# Patient Record
Sex: Male | Born: 1970 | Race: White | Hispanic: No | Marital: Single | Smoking: Never smoker
Health system: Southern US, Community
[De-identification: ages and names within clinical notes are randomized; demographics above are authoritative.]

## PROBLEM LIST (undated history)

## (undated) DIAGNOSIS — G9050 Complex regional pain syndrome I, unspecified: Secondary | ICD-10-CM

## (undated) DIAGNOSIS — R1310 Dysphagia, unspecified: Secondary | ICD-10-CM

## (undated) DIAGNOSIS — K222 Esophageal obstruction: Secondary | ICD-10-CM

## (undated) DIAGNOSIS — J309 Allergic rhinitis, unspecified: Secondary | ICD-10-CM

## (undated) DIAGNOSIS — S62309A Unspecified fracture of unspecified metacarpal bone, initial encounter for closed fracture: Secondary | ICD-10-CM

## (undated) DIAGNOSIS — K2270 Barrett's esophagus without dysplasia: Secondary | ICD-10-CM

## (undated) DIAGNOSIS — K219 Gastro-esophageal reflux disease without esophagitis: Secondary | ICD-10-CM

## (undated) DIAGNOSIS — Z91030 Bee allergy status: Secondary | ICD-10-CM

## (undated) DIAGNOSIS — Z860100 Personal history of colon polyps, unspecified: Secondary | ICD-10-CM

## (undated) DIAGNOSIS — IMO0002 Reserved for concepts with insufficient information to code with codable children: Secondary | ICD-10-CM

## (undated) DIAGNOSIS — R002 Palpitations: Secondary | ICD-10-CM

## (undated) DIAGNOSIS — Z800 Family history of malignant neoplasm of digestive organs: Secondary | ICD-10-CM

## (undated) HISTORY — DX: Dysphagia, unspecified: R13.10

## (undated) HISTORY — DX: Personal history of colon polyps, unspecified: Z86.0100

## (undated) HISTORY — DX: Bee allergy status: Z91.030

## (undated) HISTORY — PX: WRIST SURGERY: SHX841

## (undated) HISTORY — PX: COLONOSCOPY: SHX174

## (undated) HISTORY — DX: Barrett's esophagus without dysplasia: K22.70

## (undated) HISTORY — DX: Personal history of colonic polyps: Z86.010

## (undated) HISTORY — DX: Complex regional pain syndrome I, unspecified: G90.50

## (undated) HISTORY — DX: Family history of malignant neoplasm of digestive organs: Z80.0

## (undated) HISTORY — PX: KNEE SURGERY: SHX244

## (undated) HISTORY — DX: Palpitations: R00.2

## (undated) HISTORY — DX: Esophageal obstruction: K22.2

## (undated) HISTORY — DX: Allergic rhinitis, unspecified: J30.9

## (undated) HISTORY — DX: Gastro-esophageal reflux disease without esophagitis: K21.9

---

## 2001-04-27 ENCOUNTER — Encounter: Payer: Self-pay | Admitting: Specialist

## 2001-04-27 ENCOUNTER — Encounter: Admission: RE | Admit: 2001-04-27 | Discharge: 2001-04-27 | Payer: Self-pay | Admitting: Specialist

## 2002-03-18 ENCOUNTER — Encounter: Payer: Self-pay | Admitting: Specialist

## 2002-03-18 ENCOUNTER — Ambulatory Visit (HOSPITAL_COMMUNITY): Admission: RE | Admit: 2002-03-18 | Discharge: 2002-03-18 | Payer: Self-pay | Admitting: Specialist

## 2003-10-28 ENCOUNTER — Ambulatory Visit: Payer: Self-pay | Admitting: Physician Assistant

## 2004-04-06 ENCOUNTER — Ambulatory Visit: Payer: Self-pay | Admitting: Physician Assistant

## 2004-04-27 ENCOUNTER — Ambulatory Visit: Payer: Self-pay | Admitting: Pain Medicine

## 2004-05-11 ENCOUNTER — Ambulatory Visit: Payer: Self-pay | Admitting: Physician Assistant

## 2005-03-02 ENCOUNTER — Ambulatory Visit: Payer: Self-pay | Admitting: Pain Medicine

## 2005-03-18 ENCOUNTER — Encounter: Payer: Self-pay | Admitting: Pain Medicine

## 2005-03-23 ENCOUNTER — Ambulatory Visit: Payer: Self-pay | Admitting: Pain Medicine

## 2005-04-03 ENCOUNTER — Encounter: Payer: Self-pay | Admitting: Pain Medicine

## 2005-06-06 ENCOUNTER — Ambulatory Visit: Payer: Self-pay | Admitting: Pain Medicine

## 2005-06-23 ENCOUNTER — Ambulatory Visit: Payer: Self-pay | Admitting: Pain Medicine

## 2005-07-01 ENCOUNTER — Emergency Department (HOSPITAL_COMMUNITY): Admission: EM | Admit: 2005-07-01 | Discharge: 2005-07-01 | Payer: Self-pay | Admitting: Emergency Medicine

## 2005-07-04 ENCOUNTER — Encounter (HOSPITAL_COMMUNITY): Admission: RE | Admit: 2005-07-04 | Discharge: 2005-09-16 | Payer: Self-pay | Admitting: Emergency Medicine

## 2005-07-25 ENCOUNTER — Ambulatory Visit: Payer: Self-pay | Admitting: Physician Assistant

## 2005-08-23 ENCOUNTER — Ambulatory Visit: Payer: Self-pay | Admitting: Physician Assistant

## 2006-01-09 ENCOUNTER — Ambulatory Visit: Payer: Self-pay | Admitting: Physician Assistant

## 2006-01-24 ENCOUNTER — Ambulatory Visit: Payer: Self-pay | Admitting: Physician Assistant

## 2006-04-14 ENCOUNTER — Ambulatory Visit: Payer: Self-pay | Admitting: Physician Assistant

## 2006-05-16 ENCOUNTER — Ambulatory Visit: Payer: Self-pay | Admitting: Pain Medicine

## 2006-05-31 ENCOUNTER — Ambulatory Visit: Payer: Self-pay | Admitting: Pain Medicine

## 2006-08-29 ENCOUNTER — Ambulatory Visit: Payer: Self-pay | Admitting: Physician Assistant

## 2007-02-15 ENCOUNTER — Ambulatory Visit: Payer: Self-pay | Admitting: Physician Assistant

## 2007-04-11 ENCOUNTER — Ambulatory Visit: Payer: Self-pay | Admitting: Physician Assistant

## 2007-09-12 ENCOUNTER — Ambulatory Visit: Payer: Self-pay | Admitting: Physician Assistant

## 2014-06-13 ENCOUNTER — Emergency Department (HOSPITAL_BASED_OUTPATIENT_CLINIC_OR_DEPARTMENT_OTHER)
Admission: EM | Admit: 2014-06-13 | Discharge: 2014-06-13 | Disposition: A | Discharge status: Home / Self Care | Payer: 59 | Attending: Emergency Medicine | Admitting: Emergency Medicine

## 2014-06-13 ENCOUNTER — Emergency Department (HOSPITAL_BASED_OUTPATIENT_CLINIC_OR_DEPARTMENT_OTHER): Payer: 59

## 2014-06-13 ENCOUNTER — Encounter (HOSPITAL_BASED_OUTPATIENT_CLINIC_OR_DEPARTMENT_OTHER): Payer: Self-pay

## 2014-06-13 DIAGNOSIS — Z79899 Other long term (current) drug therapy: Secondary | ICD-10-CM | POA: Insufficient documentation

## 2014-06-13 DIAGNOSIS — Z7982 Long term (current) use of aspirin: Secondary | ICD-10-CM | POA: Diagnosis not present

## 2014-06-13 DIAGNOSIS — Z8669 Personal history of other diseases of the nervous system and sense organs: Secondary | ICD-10-CM | POA: Diagnosis not present

## 2014-06-13 DIAGNOSIS — W500XXA Accidental hit or strike by another person, initial encounter: Secondary | ICD-10-CM | POA: Insufficient documentation

## 2014-06-13 DIAGNOSIS — S0181XA Laceration without foreign body of other part of head, initial encounter: Secondary | ICD-10-CM

## 2014-06-13 DIAGNOSIS — Y9389 Activity, other specified: Secondary | ICD-10-CM | POA: Diagnosis not present

## 2014-06-13 DIAGNOSIS — Y998 Other external cause status: Secondary | ICD-10-CM | POA: Diagnosis not present

## 2014-06-13 DIAGNOSIS — S0992XA Unspecified injury of nose, initial encounter: Secondary | ICD-10-CM | POA: Insufficient documentation

## 2014-06-13 DIAGNOSIS — Y9289 Other specified places as the place of occurrence of the external cause: Secondary | ICD-10-CM | POA: Diagnosis not present

## 2014-06-13 DIAGNOSIS — S0990XA Unspecified injury of head, initial encounter: Secondary | ICD-10-CM | POA: Diagnosis present

## 2014-06-13 HISTORY — DX: Reserved for concepts with insufficient information to code with codable children: IMO0002

## 2014-06-13 MED ORDER — LIDOCAINE-EPINEPHRINE 1 %-1:100000 IJ SOLN
5.0000 mL | Freq: Once | INTRAMUSCULAR | Status: AC
Start: 2014-06-13 — End: 2014-06-13
  Administered 2014-06-13: 5 mL

## 2014-06-13 MED ORDER — LIDOCAINE-EPINEPHRINE 1 %-1:100000 IJ SOLN
INTRAMUSCULAR | Status: AC
  Administered 2014-06-13: 5 mL
  Filled 2014-06-13: qty 1

## 2014-06-13 MED ORDER — CEPHALEXIN 500 MG PO CAPS: 500.0000 mg | ORAL_CAPSULE | Freq: Three times a day (TID) | ORAL | Status: DC

## 2014-06-13 NOTE — ED Notes (Signed)
MD at bedside for wound repair.

## 2014-06-13 NOTE — ED Notes (Signed)
MD at bedside. 

## 2014-06-13 NOTE — Discharge Instructions (Signed)
Facial Laceration  A facial laceration is a cut on the face. These injuries can be painful and cause bleeding. Lacerations usually heal quickly, but they need special care to reduce scarring. DIAGNOSIS  Your health care provider will take a medical history, ask for details about how the injury occurred, and examine the wound to determine how deep the cut is. TREATMENT  Some facial lacerations may not require closure. Others may not be able to be closed because of an increased risk of infection. The risk of infection and the chance for successful closure will depend on various factors, including the amount of time since the injury occurred. The wound may be cleaned to help prevent infection. If closure is appropriate, pain medicines may be given if needed. Your health care provider will use stitches (sutures), wound glue (adhesive), or skin adhesive strips to repair the laceration. These tools bring the skin edges together to allow for faster healing and a better cosmetic outcome. If needed, you may also be given a tetanus shot. HOME CARE INSTRUCTIONS  Only take over-the-counter or prescription medicines as directed by your health care provider.  Follow your health care provider's instructions for wound care. These instructions will vary depending on the technique used for closing the wound. For Sutures:  Keep the wound clean and dry.   If you were given a bandage (dressing), you should change it at least once a day. Also change the dressing if it becomes wet or dirty, or as directed by your health care provider.   Wash the wound with soap and water 2 times a day. Rinse the wound off with water to remove all soap. Pat the wound dry with a clean towel.   After cleaning, apply a thin layer of the antibiotic ointment recommended by your health care provider. This will help prevent infection and keep the dressing from sticking.   You may shower as usual after the first 24 hours. Do not soak the  wound in water until the sutures are removed.   Get your sutures removed as directed by your health care provider. With facial lacerations, sutures should usually be taken out after 4-5 days to avoid stitch marks.   Wait a few days after your sutures are removed before applying any makeup. For Skin Adhesive Strips:  Keep the wound clean and dry.   Do not get the skin adhesive strips wet. You may bathe carefully, using caution to keep the wound dry.   If the wound gets wet, pat it dry with a clean towel.   Skin adhesive strips will fall off on their own. You may trim the strips as the wound heals. Do not remove skin adhesive strips that are still stuck to the wound. They will fall off in time.  For Wound Adhesive:  You may briefly wet your wound in the shower or bath. Do not soak or scrub the wound. Do not swim. Avoid periods of heavy sweating until the skin adhesive has fallen off on its own. After showering or bathing, gently pat the wound dry with a clean towel.   Do not apply liquid medicine, cream medicine, ointment medicine, or makeup to your wound while the skin adhesive is in place. This may loosen the film before your wound is healed.   If a dressing is placed over the wound, be careful not to apply tape directly over the skin adhesive. This may cause the adhesive to be pulled off before the wound is healed.   Avoid   prolonged exposure to sunlight or tanning lamps while the skin adhesive is in place.  The skin adhesive will usually remain in place for 5-10 days, then naturally fall off the skin. Do not pick at the adhesive film.  After Healing: Once the wound has healed, cover the wound with sunscreen during the day for 1 full year. This can help minimize scarring. Exposure to ultraviolet light in the first year will darken the scar. It can take 1-2 years for the scar to lose its redness and to heal completely.  SEEK IMMEDIATE MEDICAL CARE IF:  You have redness, pain, or  swelling around the wound.   You see ayellowish-white fluid (pus) coming from the wound.   You have chills or a fever.  MAKE SURE YOU:  Understand these instructions.  Will watch your condition.  Will get help right away if you are not doing well or get worse. Document Released: 01/28/2004 Document Revised: 10/10/2012 Document Reviewed: 08/02/2012 Exit[employer] Patient Information 2015 [employer]. This information is not intended to replace advice given to you by your health care provider. Make sure you discuss any questions you have with your health care provider.  
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
## 2014-08-30 ENCOUNTER — Encounter (HOSPITAL_BASED_OUTPATIENT_CLINIC_OR_DEPARTMENT_OTHER): Payer: Self-pay | Admitting: *Deleted

## 2014-08-30 ENCOUNTER — Emergency Department (HOSPITAL_BASED_OUTPATIENT_CLINIC_OR_DEPARTMENT_OTHER)
Admission: EM | Admit: 2014-08-30 | Discharge: 2014-08-30 | Disposition: A | Discharge status: Home / Self Care | Payer: 59 | Attending: Physician Assistant | Admitting: Physician Assistant

## 2014-08-30 DIAGNOSIS — Z7982 Long term (current) use of aspirin: Secondary | ICD-10-CM | POA: Diagnosis not present

## 2014-08-30 DIAGNOSIS — W228XXA Striking against or struck by other objects, initial encounter: Secondary | ICD-10-CM | POA: Insufficient documentation

## 2014-08-30 DIAGNOSIS — Y92481 Parking lot as the place of occurrence of the external cause: Secondary | ICD-10-CM | POA: Diagnosis not present

## 2014-08-30 DIAGNOSIS — Z8669 Personal history of other diseases of the nervous system and sense organs: Secondary | ICD-10-CM | POA: Insufficient documentation

## 2014-08-30 DIAGNOSIS — Z79899 Other long term (current) drug therapy: Secondary | ICD-10-CM | POA: Diagnosis not present

## 2014-08-30 DIAGNOSIS — Y998 Other external cause status: Secondary | ICD-10-CM | POA: Insufficient documentation

## 2014-08-30 DIAGNOSIS — Z792 Long term (current) use of antibiotics: Secondary | ICD-10-CM | POA: Diagnosis not present

## 2014-08-30 DIAGNOSIS — S0101XA Laceration without foreign body of scalp, initial encounter: Secondary | ICD-10-CM

## 2014-08-30 DIAGNOSIS — Y9389 Activity, other specified: Secondary | ICD-10-CM | POA: Diagnosis not present

## 2014-08-30 MED ORDER — IBUPROFEN 800 MG PO TABS
800.0000 mg | ORAL_TABLET | Freq: Once | ORAL | Status: AC
  Administered 2014-08-30: 800 mg via ORAL
  Filled 2014-08-30: qty 1
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
## 2014-08-30 NOTE — Discharge Instructions (Signed)
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
## 2016-07-09 ENCOUNTER — Encounter (HOSPITAL_BASED_OUTPATIENT_CLINIC_OR_DEPARTMENT_OTHER): Payer: Self-pay | Admitting: Emergency Medicine

## 2016-07-09 ENCOUNTER — Emergency Department (HOSPITAL_BASED_OUTPATIENT_CLINIC_OR_DEPARTMENT_OTHER): Payer: 59

## 2016-07-09 ENCOUNTER — Emergency Department (HOSPITAL_BASED_OUTPATIENT_CLINIC_OR_DEPARTMENT_OTHER)
Admission: EM | Admit: 2016-07-09 | Discharge: 2016-07-09 | Disposition: A | Discharge status: Home / Self Care | Payer: 59 | Attending: Emergency Medicine | Admitting: Emergency Medicine

## 2016-07-09 DIAGNOSIS — Z79899 Other long term (current) drug therapy: Secondary | ICD-10-CM | POA: Diagnosis not present

## 2016-07-09 DIAGNOSIS — S62347A Nondisplaced fracture of base of fifth metacarpal bone. left hand, initial encounter for closed fracture: Secondary | ICD-10-CM | POA: Diagnosis not present

## 2016-07-09 DIAGNOSIS — Y9355 Activity, bike riding: Secondary | ICD-10-CM | POA: Insufficient documentation

## 2016-07-09 DIAGNOSIS — S0181XA Laceration without foreign body of other part of head, initial encounter: Secondary | ICD-10-CM | POA: Diagnosis not present

## 2016-07-09 DIAGNOSIS — S6992XA Unspecified injury of left wrist, hand and finger(s), initial encounter: Secondary | ICD-10-CM | POA: Diagnosis present

## 2016-07-09 DIAGNOSIS — Y929 Unspecified place or not applicable: Secondary | ICD-10-CM | POA: Insufficient documentation

## 2016-07-09 DIAGNOSIS — Z23 Encounter for immunization: Secondary | ICD-10-CM | POA: Diagnosis not present

## 2016-07-09 DIAGNOSIS — Y999 Unspecified external cause status: Secondary | ICD-10-CM | POA: Diagnosis not present

## 2016-07-09 DIAGNOSIS — Z7982 Long term (current) use of aspirin: Secondary | ICD-10-CM | POA: Insufficient documentation

## 2016-07-09 MED ORDER — IBUPROFEN 800 MG PO TABS: 800.0000 mg | ORAL_TABLET | Freq: Three times a day (TID) | ORAL | 0 refills | Status: DC | PRN

## 2016-07-09 MED ORDER — TETANUS-DIPHTH-ACELL PERTUSSIS 5-2.5-18.5 LF-MCG/0.5 IM SUSP
0.5000 mL | Freq: Once | INTRAMUSCULAR | Status: AC
  Administered 2016-07-09: 0.5 mL via INTRAMUSCULAR
  Filled 2016-07-09: qty 0.5
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
## 2016-07-09 NOTE — Discharge Instructions (Signed)
It was my pleasure taking care of you today!   You will need to follow up with the hand surgeon listed. Please go to his office on Monday at 12:30pm.  Ibuprofen as needed for pain. Elevate hand for help with pain/swelling.   Return to ER for new or worsening symptoms, any additional concerns.

## 2016-07-09 NOTE — ED Notes (Signed)
ED Provider at bedside.  Went to to assess patient and Provider is doing full assessment on the patient.
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
## 2016-07-11 ENCOUNTER — Other Ambulatory Visit: Payer: Self-pay | Admitting: Orthopedic Surgery

## 2016-07-11 ENCOUNTER — Encounter (HOSPITAL_BASED_OUTPATIENT_CLINIC_OR_DEPARTMENT_OTHER): Payer: Self-pay | Admitting: *Deleted

## 2016-07-11 NOTE — Anesthesia Preprocedure Evaluation (Signed)
Anesthesia Evaluation  Patient identified by MRN, date of birth, ID band Patient awake    Reviewed: Allergy & Precautions, NPO status , Patient's Chart, lab work & pertinent test results  Airway Mallampati: II  TM Distance: >3 FB Neck ROM: Full    Dental no notable dental hx.    Pulmonary neg pulmonary ROS,    Pulmonary exam normal breath sounds clear to auscultation       Cardiovascular negative cardio ROS Normal cardiovascular exam Rhythm:Regular Rate:Normal     Neuro/Psych  Neuromuscular disease negative psych ROS   GI/Hepatic negative GI ROS, Neg liver ROS,   Endo/Other  negative endocrine ROS  Renal/GU negative Renal ROS     Musculoskeletal negative musculoskeletal ROS (+)   Abdominal   Peds  Hematology negative hematology ROS (+)   Anesthesia Other Findings   Reproductive/Obstetrics                             Anesthesia Physical Anesthesia Plan  ASA: II  Anesthesia Plan: General and Regional   Post-op Pain Management:    Induction: Intravenous  PONV Risk Score and Plan: 2 and Ondansetron and Dexamethasone  Airway Management Planned:   Additional Equipment:   Intra-op Plan:   Post-operative Plan: Extubation in OR  Informed Consent: I have reviewed the patients History and Physical, chart, labs and discussed the procedure including the risks, benefits and alternatives for the proposed anesthesia with the patient or authorized representative who has indicated his/her understanding and acceptance.   Dental advisory given  Plan Discussed with: CRNA  Anesthesia Plan Comments:         Anesthesia Quick Evaluation

## 2016-07-12 ENCOUNTER — Encounter (HOSPITAL_BASED_OUTPATIENT_CLINIC_OR_DEPARTMENT_OTHER): Payer: Self-pay | Admitting: Anesthesiology

## 2016-07-12 ENCOUNTER — Encounter (HOSPITAL_BASED_OUTPATIENT_CLINIC_OR_DEPARTMENT_OTHER)
Admission: RE | Disposition: A | Discharge status: Home / Self Care | Payer: Self-pay | Source: Ambulatory Visit | Attending: Orthopedic Surgery

## 2016-07-12 ENCOUNTER — Ambulatory Visit (HOSPITAL_COMMUNITY): Payer: 59

## 2016-07-12 ENCOUNTER — Ambulatory Visit (HOSPITAL_BASED_OUTPATIENT_CLINIC_OR_DEPARTMENT_OTHER): Payer: 59 | Admitting: Anesthesiology

## 2016-07-12 ENCOUNTER — Ambulatory Visit (HOSPITAL_BASED_OUTPATIENT_CLINIC_OR_DEPARTMENT_OTHER)
Admission: RE | Admit: 2016-07-12 | Discharge: 2016-07-12 | Disposition: A | Discharge status: Home / Self Care | Payer: 59 | Source: Ambulatory Visit | Attending: Orthopedic Surgery | Admitting: Orthopedic Surgery

## 2016-07-12 DIAGNOSIS — G90511 Complex regional pain syndrome I of right upper limb: Secondary | ICD-10-CM | POA: Diagnosis not present

## 2016-07-12 DIAGNOSIS — Y929 Unspecified place or not applicable: Secondary | ICD-10-CM | POA: Diagnosis not present

## 2016-07-12 DIAGNOSIS — I2510 Atherosclerotic heart disease of native coronary artery without angina pectoris: Secondary | ICD-10-CM | POA: Insufficient documentation

## 2016-07-12 DIAGNOSIS — Y9355 Activity, bike riding: Secondary | ICD-10-CM | POA: Insufficient documentation

## 2016-07-12 DIAGNOSIS — Z79899 Other long term (current) drug therapy: Secondary | ICD-10-CM | POA: Diagnosis not present

## 2016-07-12 DIAGNOSIS — Z4789 Encounter for other orthopedic aftercare: Secondary | ICD-10-CM

## 2016-07-12 DIAGNOSIS — S62317A Displaced fracture of base of fifth metacarpal bone. left hand, initial encounter for closed fracture: Secondary | ICD-10-CM | POA: Diagnosis present

## 2016-07-12 HISTORY — DX: Unspecified fracture of unspecified metacarpal bone, initial encounter for closed fracture: S62.309A

## 2016-07-12 HISTORY — PX: OPEN REDUCTION INTERNAL FIXATION (ORIF) METACARPAL: SHX6234

## 2016-07-12 SURGERY — OPEN REDUCTION INTERNAL FIXATION (ORIF) METACARPAL
Anesthesia: General | Site: Hand | Laterality: Left

## 2016-07-12 MED ORDER — KETOROLAC TROMETHAMINE 30 MG/ML IJ SOLN: 30.0000 mg | Freq: Once | INTRAMUSCULAR | Status: DC | PRN

## 2016-07-12 MED ORDER — DEXAMETHASONE SODIUM PHOSPHATE 4 MG/ML IJ SOLN
INTRAMUSCULAR | Status: DC | PRN
  Administered 2016-07-12: 10 mg via INTRAVENOUS

## 2016-07-12 MED ORDER — LIDOCAINE 2% (20 MG/ML) 5 ML SYRINGE
INTRAMUSCULAR | Status: DC | PRN
  Administered 2016-07-12: 100 mg via INTRAVENOUS

## 2016-07-12 MED ORDER — LIDOCAINE HCL (PF) 1 % IJ SOLN
INTRAMUSCULAR | Status: AC
  Filled 2016-07-12: qty 30

## 2016-07-12 MED ORDER — LACTATED RINGERS IV SOLN
INTRAVENOUS | Status: DC
  Administered 2016-07-12: 07:00:00 via INTRAVENOUS

## 2016-07-12 MED ORDER — ACETAMINOPHEN 325 MG PO TABS: 650.0000 mg | ORAL_TABLET | Freq: Four times a day (QID) | ORAL | Status: AC | PRN

## 2016-07-12 MED ORDER — BUPIVACAINE-EPINEPHRINE (PF) 0.5% -1:200000 IJ SOLN
INTRAMUSCULAR | Status: AC
  Filled 2016-07-12: qty 30

## 2016-07-12 MED ORDER — ONDANSETRON HCL 4 MG/2ML IJ SOLN
INTRAMUSCULAR | Status: DC | PRN
  Administered 2016-07-12: 4 mg via INTRAVENOUS

## 2016-07-12 MED ORDER — LIDOCAINE HCL (PF) 1 % IJ SOLN
INTRAMUSCULAR | Status: DC | PRN
  Administered 2016-07-12: 5 mL

## 2016-07-12 MED ORDER — MEPERIDINE HCL 25 MG/ML IJ SOLN: 6.2500 mg | INTRAMUSCULAR | Status: DC | PRN

## 2016-07-12 MED ORDER — MIDAZOLAM HCL 2 MG/2ML IJ SOLN
1.0000 mg | INTRAMUSCULAR | Status: DC | PRN
Start: 2016-07-12 — End: 2016-07-12
  Administered 2016-07-12: 2 mg via INTRAVENOUS

## 2016-07-12 MED ORDER — SCOPOLAMINE 1 MG/3DAYS TD PT72: 1.0000 | MEDICATED_PATCH | Freq: Once | TRANSDERMAL | Status: DC | PRN

## 2016-07-12 MED ORDER — IBUPROFEN 200 MG PO TABS: 600.0000 mg | ORAL_TABLET | Freq: Four times a day (QID) | ORAL | Status: DC | PRN

## 2016-07-12 MED ORDER — PROMETHAZINE HCL 25 MG/ML IJ SOLN: 6.2500 mg | INTRAMUSCULAR | Status: DC | PRN

## 2016-07-12 MED ORDER — CEFAZOLIN SODIUM-DEXTROSE 2-4 GM/100ML-% IV SOLN
2.0000 g | INTRAVENOUS | Status: AC
  Administered 2016-07-12: 2 g via INTRAVENOUS

## 2016-07-12 MED ORDER — FENTANYL CITRATE (PF) 100 MCG/2ML IJ SOLN
INTRAMUSCULAR | Status: AC
  Filled 2016-07-12: qty 2

## 2016-07-12 MED ORDER — ONDANSETRON HCL 4 MG/2ML IJ SOLN
INTRAMUSCULAR | Status: AC
  Filled 2016-07-12: qty 2

## 2016-07-12 MED ORDER — BUPIVACAINE-EPINEPHRINE 0.5% -1:200000 IJ SOLN
INTRAMUSCULAR | Status: DC | PRN
Start: 2016-07-12 — End: 2016-07-12
  Administered 2016-07-12: 5 mg

## 2016-07-12 MED ORDER — OXYCODONE HCL 5 MG PO TABS: 5.0000 mg | ORAL_TABLET | Freq: Four times a day (QID) | ORAL | 0 refills | Status: DC | PRN

## 2016-07-12 MED ORDER — PROPOFOL 500 MG/50ML IV EMUL
INTRAVENOUS | Status: AC
  Filled 2016-07-12: qty 50

## 2016-07-12 MED ORDER — OXYCODONE HCL 5 MG PO TABS: 5.0000 mg | ORAL_TABLET | Freq: Once | ORAL | Status: DC | PRN

## 2016-07-12 MED ORDER — HYDROMORPHONE HCL 1 MG/ML IJ SOLN: 0.2500 mg | INTRAMUSCULAR | Status: DC | PRN

## 2016-07-12 MED ORDER — CEFAZOLIN SODIUM-DEXTROSE 2-4 GM/100ML-% IV SOLN
INTRAVENOUS | Status: AC
  Filled 2016-07-12: qty 100

## 2016-07-12 MED ORDER — OXYCODONE HCL 5 MG/5ML PO SOLN: 5.0000 mg | Freq: Once | ORAL | Status: DC | PRN

## 2016-07-12 MED ORDER — PROPOFOL 10 MG/ML IV BOLUS
INTRAVENOUS | Status: DC | PRN
  Administered 2016-07-12: 50 mg via INTRAVENOUS
  Administered 2016-07-12: 200 mg via INTRAVENOUS

## 2016-07-12 MED ORDER — FENTANYL CITRATE (PF) 100 MCG/2ML IJ SOLN
50.0000 ug | INTRAMUSCULAR | Status: AC | PRN
  Administered 2016-07-12 (×2): 25 ug via INTRAVENOUS
  Administered 2016-07-12: 100 ug via INTRAVENOUS

## 2016-07-12 MED ORDER — DEXAMETHASONE SODIUM PHOSPHATE 10 MG/ML IJ SOLN
INTRAMUSCULAR | Status: AC
  Filled 2016-07-12: qty 1

## 2016-07-12 MED ORDER — LACTATED RINGERS IV SOLN: INTRAVENOUS | Status: DC

## 2016-07-12 MED ORDER — LIDOCAINE HCL (CARDIAC) 20 MG/ML IV SOLN
INTRAVENOUS | Status: AC
  Filled 2016-07-12: qty 5

## 2016-07-12 MED ORDER — MIDAZOLAM HCL 2 MG/2ML IJ SOLN
INTRAMUSCULAR | Status: AC
Start: 2016-07-12 — End: 2016-07-12
  Filled 2016-07-12: qty 2

## 2016-07-12 SURGICAL SUPPLY — 50 items
BLADE MINI RND TIP GREEN BEAV (BLADE) IMPLANT
BLADE SURG 15 STRL LF DISP TIS (BLADE) ×1 IMPLANT
BLADE SURG 15 STRL SS (BLADE) ×2
BNDG CMPR 9X4 STRL LF SNTH (GAUZE/BANDAGES/DRESSINGS) ×1
BNDG COHESIVE 4X5 TAN STRL (GAUZE/BANDAGES/DRESSINGS) ×2 IMPLANT
BNDG ESMARK 4X9 LF (GAUZE/BANDAGES/DRESSINGS) ×2 IMPLANT
BNDG GAUZE ELAST 4 BULKY (GAUZE/BANDAGES/DRESSINGS) ×2 IMPLANT
CANISTER SUCTION 1200CC (MISCELLANEOUS) IMPLANT
CHLORAPREP W/TINT 26ML (MISCELLANEOUS) ×2 IMPLANT
CORD BIPOLAR FORCEPS 12FT (ELECTRODE) ×2 IMPLANT
COVER BACK TABLE 60X90IN (DRAPES) ×2 IMPLANT
COVER MAYO STAND STRL (DRAPES) ×2 IMPLANT
CUFF TOURNIQUET SINGLE 18IN (TOURNIQUET CUFF) IMPLANT
DRAPE C-ARM 42X72 X-RAY (DRAPES) ×2 IMPLANT
DRAPE EXTREMITY T 121X128X90 (DRAPE) ×2 IMPLANT
DRAPE SURG 17X23 STRL (DRAPES) ×2 IMPLANT
DRSG EMULSION OIL 3X3 NADH (GAUZE/BANDAGES/DRESSINGS) ×2 IMPLANT
GAUZE SPONGE 4X4 12PLY STRL LF (GAUZE/BANDAGES/DRESSINGS) ×2 IMPLANT
GLOVE BIO SURGEON STRL SZ 6.5 (GLOVE) ×2 IMPLANT
GLOVE BIO SURGEON STRL SZ7.5 (GLOVE) ×2 IMPLANT
GLOVE BIOGEL PI IND STRL 7.0 (GLOVE) ×1 IMPLANT
GLOVE BIOGEL PI IND STRL 8 (GLOVE) ×1 IMPLANT
GLOVE BIOGEL PI INDICATOR 7.0 (GLOVE) ×4
GLOVE BIOGEL PI INDICATOR 8 (GLOVE) ×1
GLOVE ECLIPSE 6.5 STRL STRAW (GLOVE) ×2 IMPLANT
GOWN STRL REUS W/ TWL LRG LVL3 (GOWN DISPOSABLE) ×2 IMPLANT
GOWN STRL REUS W/TWL LRG LVL3 (GOWN DISPOSABLE) ×4
GOWN STRL REUS W/TWL XL LVL3 (GOWN DISPOSABLE) ×2 IMPLANT
K-WIRE .062X4 (WIRE) ×2 IMPLANT
NDL HYPO 25X1 1.5 SAFETY (NEEDLE) IMPLANT
NEEDLE HYPO 25X1 1.5 SAFETY (NEEDLE) IMPLANT
NS IRRIG 1000ML POUR BTL (IV SOLUTION) ×2 IMPLANT
PACK BASIN DAY SURGERY FS (CUSTOM PROCEDURE TRAY) ×2 IMPLANT
PADDING CAST ABS 4INX4YD NS (CAST SUPPLIES)
PADDING CAST ABS COTTON 4X4 ST (CAST SUPPLIES) IMPLANT
SLEEVE SCD COMPRESS KNEE MED (MISCELLANEOUS) ×2 IMPLANT
SPLINT PLASTER CAST XFAST 3X15 (CAST SUPPLIES) ×1 IMPLANT
SPLINT PLASTER XTRA FASTSET 3X (CAST SUPPLIES) ×1
STOCKINETTE 6  STRL (DRAPES) ×1
STOCKINETTE 6 STRL (DRAPES) ×1 IMPLANT
SUCTION FRAZIER HANDLE 10FR (MISCELLANEOUS)
SUCTION TUBE FRAZIER 10FR DISP (MISCELLANEOUS) IMPLANT
SUT VICRYL RAPIDE 4-0 (SUTURE) IMPLANT
SUT VICRYL RAPIDE 4/0 PS 2 (SUTURE) IMPLANT
SYR 10ML LL (SYRINGE) IMPLANT
SYR BULB 3OZ (MISCELLANEOUS) IMPLANT
TOWEL OR 17X24 6PK STRL BLUE (TOWEL DISPOSABLE) ×2 IMPLANT
TOWEL OR NON WOVEN STRL DISP B (DISPOSABLE) IMPLANT
TUBE CONNECTING 20X1/4 (TUBING) IMPLANT
UNDERPAD 30X30 (UNDERPADS AND DIAPERS) ×2 IMPLANT

## 2016-07-12 NOTE — Op Note (Signed)
07/12/2016  7:31 AM  PATIENT:  Eugene Davis  46 y.o. male  PRE-OPERATIVE DIAGNOSIS:  Displaced intra-articular left 5th MC base fracture  POST-OPERATIVE DIAGNOSIS:  Same  PROCEDURE:  ORIF L 5th MC fx  SURGEON: Cliffton Astersavid A. Janee Mornhompson, MD  PHYSICIAN ASSISTANT: None  ANESTHESIA:  general  SPECIMENS:  None  DRAINS:   None  EBL:  less than 50 mL  PREOPERATIVE INDICATIONS:  [name] is a  46 y.o. male with a displaced intra-articular left 5th MC base fracture  The risks benefits and alternatives were discussed with the patient preoperatively including but not limited to the risks of infection, bleeding, nerve injury, cardiopulmonary complications, the need for revision surgery, among others, and the patient verbalized understanding and consented to proceed.  OPERATIVE IMPLANTS: 0.062 in K-wires x 2  OPERATIVE PROCEDURE:  After receiving prophylactic antibiotics, the patient was escorted to the operative theatre and placed in a supine position. General anesthesia was administered, and an ulnar nerve block was performed by me with a mixture of lidocaine and Marcaine bearing epinephrine.  A surgical "time-out" was performed during which the planned procedure, proposed operative site, and the correct patient identity were compared to the operative consent and agreement confirmed by the circulating nurse according to current facility policy.  Following application of a tourniquet to the operative extremity, the exposed skin was pre-scrubbed with a Hibiclens scrub brush before being formally prepped with Chloraprep and draped in the usual sterile fashion.  The limb was exsanguinated with an Esmarch bandage and the tourniquet inflated to approximately 100mmHg higher than systolic BP.  The fifth metacarpal length was gained through traction and a 0.062 inch K wire driven across the fifth into the fourth.  This gained appropriate length of the metacarpal.  I tried percutaneously manipulating the  basal fragments, but this proved unsuccessful.  A 1-1/2 inch incision was made over the base of the metacarpal and exposing it in this manner, there was a large articular segment that was impacted and displaced quite a bit.  It was placed into anatomic alignment and secured with a 0.062 inch K wire driven again across the base of the fifth into the fourth, securing the articular reduction.  With the segment thus disimpacted and some of the crumbled fragments placed as a graft into the area that was previously impacted, the wound is copiously irrigated and the capsule ligamentous layer at the base of the fifth was reapproximated with 4-0 Vicryl Rapide interrupted suture.  The wound was irrigated, the tourniquet released, additional hemostasis obtained with bipolar electrocautery.  The skin was closed with 4-0 Vicryl Rapide running horizontal mattress suture.  The K wires were bent 90 at the skin and clipped and final images were obtained.  This revealed near-anatomic alignment of the articular surface with good length of the fifth metacarpal.  A short arm splint dressing was applied with a volar plaster component and he was awakened and taken to the recovery room in stable condition, breathing spontaneously.  DISPOSITION: He'll be discharged home today with typical instructions, returning in 7-10 days, with new x-rays of the left hand out of splint and conversion to short arm cast.

## 2016-07-12 NOTE — Transfer of Care (Signed)
Immediate Anesthesia Transfer of Care Note  Patient: Joretta BachelorHarvey A Gersten  Procedure(s) Performed: Procedure(s): OPEN TREATMENT OF LEFT 5TH METACARPAL BASE FRACTURE (Left)  Patient Location: PACU  Anesthesia Type:General  Level of Consciousness: awake, sedated and patient cooperative  Airway & Oxygen Therapy: Patient Spontanous Breathing and Patient connected to face mask oxygen  Post-op Assessment: Report given to RN and Post -op Vital signs reviewed and stable  Post vital signs: Reviewed and stable  Last Vitals:  Vitals:   07/11/16 1017  BP: 136/79  Pulse: 66  Resp: 18  Temp: 36.7 C    Last Pain:  Vitals:   07/11/16 1017  TempSrc: Oral  PainSc: 0-No pain      Patients Stated Pain Goal: 0 (07/11/16 1017)  Complications: No apparent anesthesia complications

## 2016-07-12 NOTE — Anesthesia Procedure Notes (Signed)
Procedure Name: LMA Insertion Date/Time: 07/12/2016 7:40 AM Performed by: Gar GibbonKEETON, Willene Holian S Pre-anesthesia Checklist: Patient identified, Emergency Drugs available, Suction available and Patient being monitored Patient Re-evaluated:Patient Re-evaluated prior to inductionOxygen Delivery Method: Circle system utilized Preoxygenation: Pre-oxygenation with 100% oxygen Intubation Type: IV induction Ventilation: Mask ventilation without difficulty LMA: LMA inserted LMA Size: 5.0 Number of attempts: 1 Airway Equipment and Method: Bite block Placement Confirmation: positive ETCO2 Tube secured with: Tape Dental Injury: Teeth and Oropharynx as per pre-operative assessment

## 2016-07-12 NOTE — Interval H&P Note (Signed)
History and Physical Interval Note:  07/12/2016 7:31 AM  Eugene Davis  has presented today for surgery, with the diagnosis of LEFT 5TH METACARPAL BASE FRACTURE S62.317A  The various methods of treatment have been discussed with the patient and family. After consideration of risks, benefits and other options for treatment, the patient has consented to  Procedure(s): OPEN TREATMENT OF LEFT 5TH METACARPAL BASE FRACTURE (Left) as a surgical intervention .  The patient's history has been reviewed, patient examined, no change in status, stable for surgery.  I have reviewed the patient's chart and labs.  Questions were answered to the patient's satisfaction.     Brae Schaafsma A.

## 2016-07-12 NOTE — Anesthesia Postprocedure Evaluation (Addendum)
Anesthesia Post Note  Patient: Eugene Davis  Procedure(s) Performed: Procedure(s) (LRB): OPEN TREATMENT OF LEFT 5TH METACARPAL BASE FRACTURE (Left)     Patient location during evaluation: PACU Anesthesia Type: General Level of consciousness: sedated and patient cooperative Pain management: pain level controlled Vital Signs Assessment: post-procedure vital signs reviewed and stable Respiratory status: spontaneous breathing Cardiovascular status: stable Anesthetic complications: no    Last Vitals:  Vitals:   07/12/16 0915 07/12/16 0927  BP: 134/86 138/87  Pulse: 74 69  Resp: 11   Temp:  36.4 C    Last Pain:  Vitals:   07/12/16 0927  TempSrc: Oral  PainSc:                  Lewie LoronJohn Willman Cuny

## 2016-07-12 NOTE — Discharge Instructions (Addendum)
Discharge Instructions ° ° °You have a dressing with a plaster splint incorporated in it. °Move your fingers as much as possible, making a full fist and fully opening the fist. °Elevate your hand to reduce pain & swelling of the digits.  Ice over the operative site may be helpful to reduce pain & swelling.  DO NOT USE HEAT. °Leave the dressing in place until you return to our office.  °You may shower, but keep the bandage clean & dry.  °You may drive a car when you are off of prescription pain medications and can safely control your vehicle with both hands. ° ° °Please call [phone] during normal business hours or [phone] after hours for any problems. Including the following: ° °- excessive redness of the incisions °- drainage for more than 4 days °- fever of more than 101.5 F ° °*Please note that pain medications will not be refilled after hours or on weekends. ° ° ° °Post Anesthesia Home Care Instructions ° °Activity: °Get plenty of rest for the remainder of the day. A responsible individual must stay with you for 24 hours following the procedure.  °For the next 24 hours, DO NOT: °-Drive a car °-Operate machinery °-Drink alcoholic beverages °-Take any medication unless instructed by your physician °-Make any legal decisions or sign important papers. ° °Meals: °Start with liquid foods such as gelatin or soup. Progress to regular foods as tolerated. Avoid greasy, spicy, heavy foods. If nausea and/or vomiting occur, drink only clear liquids until the nausea and/or vomiting subsides. Call your physician if vomiting continues. ° °Special Instructions/Symptoms: °Your throat may feel dry or sore from the anesthesia or the breathing tube placed in your throat during surgery. If this causes discomfort, gargle with warm salt water. The discomfort should disappear within 24 hours. ° °If you had a scopolamine patch placed behind your ear for the management of post- operative nausea and/or vomiting: ° °1. The  medication in the patch is effective for 72 hours, after which it should be removed.  Wrap patch in a tissue and discard in the trash. Wash hands thoroughly with soap and water. °2. You may remove the patch earlier than 72 hours if you experience unpleasant side effects which may include dry mouth, dizziness or visual disturbances. °3. Avoid touching the patch. Wash your hands with soap and water after contact with the patch. °  ° ° °
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
## 2016-07-12 NOTE — Interval H&P Note (Signed)
History and Physical Interval Note:  07/12/2016 6:53 AM  Eugene Davis  has presented today for surgery, with the diagnosis of LEFT 5TH METACARPAL BASE FRACTURE S62.317A  The various methods of treatment have been discussed with the patient and family. After consideration of risks, benefits and other options for treatment, the patient has consented to  Procedure(s): OPEN TREATMENT OF LEFT 5TH METACARPAL BASE FRACTURE (Left) as a surgical intervention .  The patient's history has been reviewed, patient examined, no change in status, stable for surgery.  I have reviewed the patient's chart and labs.  Questions were answered to the patient's satisfaction.     Tannor Pyon A.

## 2016-07-13 ENCOUNTER — Encounter (HOSPITAL_BASED_OUTPATIENT_CLINIC_OR_DEPARTMENT_OTHER): Payer: Self-pay | Admitting: Orthopedic Surgery

## 2018-09-18 ENCOUNTER — Other Ambulatory Visit: Payer: Self-pay | Admitting: Gastroenterology

## 2018-09-18 DIAGNOSIS — R1310 Dysphagia, unspecified: Secondary | ICD-10-CM

## 2018-09-19 ENCOUNTER — Other Ambulatory Visit: Payer: Self-pay | Admitting: Emergency Medicine

## 2018-09-19 DIAGNOSIS — Z20822 Contact with and (suspected) exposure to covid-19: Secondary | ICD-10-CM

## 2018-09-20 LAB — NOVEL CORONAVIRUS, NAA: SARS-CoV-2, NAA: NOT DETECTED

## 2018-09-25 ENCOUNTER — Ambulatory Visit
Admission: RE | Admit: 2018-09-25 | Discharge: 2018-09-25 | Disposition: A | Discharge status: Home / Self Care | Payer: 59 | Source: Ambulatory Visit | Attending: Gastroenterology | Admitting: Gastroenterology

## 2018-09-25 DIAGNOSIS — R1310 Dysphagia, unspecified: Secondary | ICD-10-CM

## 2018-10-02 ENCOUNTER — Other Ambulatory Visit: Payer: Self-pay | Admitting: Gastroenterology

## 2018-10-05 ENCOUNTER — Encounter (HOSPITAL_COMMUNITY): Payer: Self-pay | Admitting: Emergency Medicine

## 2018-10-05 ENCOUNTER — Other Ambulatory Visit: Payer: Self-pay

## 2018-10-05 NOTE — Progress Notes (Signed)
Pre-op endo call completed  

## 2018-10-06 ENCOUNTER — Other Ambulatory Visit (HOSPITAL_COMMUNITY)
Admission: RE | Admit: 2018-10-06 | Discharge: 2018-10-06 | Disposition: A | Discharge status: Home / Self Care | Payer: Managed Care, Other (non HMO) | Source: Ambulatory Visit | Attending: Gastroenterology | Admitting: Gastroenterology

## 2018-10-06 DIAGNOSIS — Z20828 Contact with and (suspected) exposure to other viral communicable diseases: Secondary | ICD-10-CM | POA: Diagnosis not present

## 2018-10-06 DIAGNOSIS — Z01812 Encounter for preprocedural laboratory examination: Secondary | ICD-10-CM | POA: Diagnosis present

## 2018-10-08 LAB — NOVEL CORONAVIRUS, NAA (HOSP ORDER, SEND-OUT TO REF LAB; TAT 18-24 HRS): SARS-CoV-2, NAA: NOT DETECTED

## 2018-10-09 NOTE — Anesthesia Preprocedure Evaluation (Addendum)
Anesthesia Evaluation  Patient identified by MRN, date of birth, ID band Patient awake    Reviewed: Allergy & Precautions, NPO status , Patient's Chart, lab work & pertinent test results  History of Anesthesia Complications Negative for: history of anesthetic complications  Airway Mallampati: II  TM Distance: >3 FB Neck ROM: Full    Dental no notable dental hx. (+) Dental Advisory Given   Pulmonary neg pulmonary ROS,    Pulmonary exam normal        Cardiovascular negative cardio ROS Normal cardiovascular exam     Neuro/Psych  Neuromuscular disease negative psych ROS   GI/Hepatic negative GI ROS, Neg liver ROS,   Endo/Other  negative endocrine ROS  Renal/GU negative Renal ROS     Musculoskeletal negative musculoskeletal ROS (+)   Abdominal   Peds  Hematology negative hematology ROS (+)   Anesthesia Other Findings   Reproductive/Obstetrics                            Anesthesia Physical  Anesthesia Plan  ASA: II  Anesthesia Plan: MAC   Post-op Pain Management:    Induction:   PONV Risk Score and Plan: 2 and Ondansetron and Propofol infusion  Airway Management Planned: Natural Airway  Additional Equipment:   Intra-op Plan:   Post-operative Plan:   Informed Consent: I have reviewed the patients History and Physical, chart, labs and discussed the procedure including the risks, benefits and alternatives for the proposed anesthesia with the patient or authorized representative who has indicated his/her understanding and acceptance.     Dental advisory given  Plan Discussed with: Anesthesiologist and CRNA  Anesthesia Plan Comments:        Anesthesia Quick Evaluation

## 2018-10-09 NOTE — Progress Notes (Signed)
Spoke with patient, has quarantined since covid 19 testing without fever or symptoms, answered question regarding procedure, aware NPO after MN, arrival time 0700

## 2018-10-10 ENCOUNTER — Ambulatory Visit (HOSPITAL_COMMUNITY): Payer: Managed Care, Other (non HMO)

## 2018-10-10 ENCOUNTER — Ambulatory Visit (HOSPITAL_COMMUNITY): Payer: Managed Care, Other (non HMO) | Admitting: Anesthesiology

## 2018-10-10 ENCOUNTER — Encounter (HOSPITAL_COMMUNITY): Payer: Self-pay | Admitting: Anesthesiology

## 2018-10-10 ENCOUNTER — Other Ambulatory Visit: Payer: Self-pay

## 2018-10-10 ENCOUNTER — Ambulatory Visit (HOSPITAL_COMMUNITY)
Admission: RE | Admit: 2018-10-10 | Discharge: 2018-10-10 | Disposition: A | Discharge status: Home / Self Care | Payer: Managed Care, Other (non HMO) | Attending: Gastroenterology | Admitting: Gastroenterology

## 2018-10-10 ENCOUNTER — Encounter (HOSPITAL_COMMUNITY)
Admission: RE | Disposition: A | Discharge status: Home / Self Care | Payer: Self-pay | Source: Home / Self Care | Attending: Gastroenterology

## 2018-10-10 DIAGNOSIS — K2100 Gastro-esophageal reflux disease with esophagitis, without bleeding: Secondary | ICD-10-CM | POA: Insufficient documentation

## 2018-10-10 DIAGNOSIS — K449 Diaphragmatic hernia without obstruction or gangrene: Secondary | ICD-10-CM | POA: Insufficient documentation

## 2018-10-10 DIAGNOSIS — K222 Esophageal obstruction: Secondary | ICD-10-CM | POA: Insufficient documentation

## 2018-10-10 DIAGNOSIS — R1310 Dysphagia, unspecified: Secondary | ICD-10-CM | POA: Diagnosis present

## 2018-10-10 HISTORY — PX: ESOPHAGOGASTRODUODENOSCOPY (EGD) WITH PROPOFOL: SHX5813

## 2018-10-10 HISTORY — PX: BALLOON DILATION: SHX5330

## 2018-10-10 SURGERY — ESOPHAGOGASTRODUODENOSCOPY (EGD) WITH PROPOFOL
Anesthesia: Monitor Anesthesia Care

## 2018-10-10 MED ORDER — PROPOFOL 10 MG/ML IV BOLUS
INTRAVENOUS | Status: AC
  Filled 2018-10-10: qty 60

## 2018-10-10 MED ORDER — MIDAZOLAM HCL 2 MG/2ML IJ SOLN
INTRAMUSCULAR | Status: AC
  Filled 2018-10-10: qty 2

## 2018-10-10 MED ORDER — PROPOFOL 500 MG/50ML IV EMUL
INTRAVENOUS | Status: DC | PRN
  Administered 2018-10-10: 10 mg via INTRAVENOUS
  Administered 2018-10-10: 50 mg via INTRAVENOUS

## 2018-10-10 MED ORDER — RABEPRAZOLE SODIUM 20 MG PO TBEC: 20.0000 mg | DELAYED_RELEASE_TABLET | Freq: Two times a day (BID) | ORAL | 4 refills | Status: DC

## 2018-10-10 MED ORDER — PROPOFOL 500 MG/50ML IV EMUL
INTRAVENOUS | Status: DC | PRN
  Administered 2018-10-10: 125 ug/kg/min via INTRAVENOUS

## 2018-10-10 MED ORDER — LACTATED RINGERS IV SOLN
INTRAVENOUS | Status: DC
  Administered 2018-10-10: 1000 mL via INTRAVENOUS

## 2018-10-10 MED ORDER — MIDAZOLAM HCL 5 MG/5ML IJ SOLN
INTRAMUSCULAR | Status: DC | PRN
  Administered 2018-10-10: 2 mg via INTRAVENOUS

## 2018-10-10 MED ORDER — SODIUM CHLORIDE 0.9 % IV SOLN: INTRAVENOUS | Status: DC

## 2018-10-10 SURGICAL SUPPLY — 15 items

## 2018-10-10 NOTE — H&P (Signed)
Patient interval history reviewed.  Patient examined again.  There has been no change from documented H/P scanned into chart from our office except as documented below.  Assessment:  1.  GERD. 2.  Dysphagia. 3.  Abnormal esophagram:  Narrowing in proximal esophagus.  Plan:  1.  Endoscopy with possible esophageal dilatation. 2.  Risks (bleeding, infection, bowel perforation that could require surgery, sedation-related changes in cardiopulmonary systems), benefits (identification and possible treatment of source of symptoms, exclusion of certain causes of symptoms), and alternatives (watchful waiting, radiographic imaging studies, empiric medical treatment) of upper endoscopy with possible esophageal dilatation (EGD +/- DIL) were explained to patient/family in detail and patient wishes to proceed.

## 2018-10-10 NOTE — Op Note (Signed)
[facility] Patient Name: Eugene Davis Procedure Date: 10/10/2018 MRN: [id number] Attending MD: Arta Silence , MD Date of Birth: May 13, 1970 CSN: [id number] Age: 48 Admit Type: Outpatient Procedure:                Upper GI endoscopy Indications:              Dysphagia, Abnormal cine-esophagram Providers:                Arta Silence, MD, Angus Seller, Abenezer Odonell Dalton, Technician Referring MD:              Medicines:                Monitored Anesthesia Care Complications:            No immediate complications. Estimated Blood Loss:     Estimated blood loss was minimal. Procedure:                Pre-Anesthesia Assessment:                           - Prior to the procedure, a History and Physical                            was performed, and patient medications and                            allergies were reviewed. The patient's tolerance of                            previous anesthesia was also reviewed. The risks                            and benefits of the procedure and the sedation                            options and risks were discussed with the patient.                            All questions were answered, and informed consent                            was obtained. Prior Anticoagulants: The patient has                            taken no previous anticoagulant or antiplatelet                            agents. ASA Grade Assessment: II - A patient with                            mild systemic disease. After reviewing the risks  and benefits, the patient was deemed in                            satisfactory condition to undergo the procedure.                           After obtaining informed consent, the endoscope was                            passed under direct vision. Throughout the                            procedure, the patient's blood pressure, pulse, and                            oxygen  saturations were monitored continuously. The                            GIF-H190 ([id number] Olympus gastroscope was                            introduced through the mouth, and advanced to the                            second part of duodenum. The upper GI endoscopy was                            accomplished without difficulty. The patient                            tolerated the procedure well. Scope In: Scope Out: Findings:      One benign-appearing, intrinsic severe (stenosis; an endoscope cannot       pass) stenosis was found about 26 cm from the incisors, a few cm distal       to the upper esophageal sphincter, estimated luminal diameter about 8mm.       This stenosis measured less than one cm (in length). A TTS dilator was       passed through the scope. Dilation with an 08-11-08 mm balloon (no mucosal       disruption) and a 10-14-10 mm balloon dilator (mild disruption after       11mm, moderate mucosal disruption after 12mm) was performed to 12 mm       under fluoroscopic guidance. The dilation site was examined and showed       moderate mucosal disruption and mild improvement in luminal narrowing.       The stenosis was traversed after dilation. Estimated blood loss was       minimal.      LA Grade B (one or more mucosal breaks greater than 5 mm, not extending       between the tops of two mucosal folds) esophagitis with no bleeding was       found.      Salmon-colored mucosa was present, circumferentially, throughout the       entire esophagus distal to the proximal esophageal stricture.      A 4 cm hiatal hernia was present.  The entire examined stomach was normal.      The duodenal bulb, first portion of the duodenum and second portion of       the duodenum were normal. Impression:               - Benign-appearing esophageal stenosis. Dilated.                           - LA Grade B reflux esophagitis.                           - Salmon-colored mucosa suspicious for  long-segment                            Barrett's esophagus, involving the entire esophagus                            distal to the upper esophageal stricture..                           - 4 cm hiatal hernia.                           - Normal stomach.                           - Normal duodenal bulb, first portion of the                            duodenum and second portion of the duodenum. Moderate Sedation:      Not Applicable - Patient had care per Anesthesia. Recommendation:           - Patient has a contact number available for                            emergencies. The signs and symptoms of potential                            delayed complications were discussed with the                            patient. Return to normal activities tomorrow.                            Written discharge instructions were provided to the                            patient.                           - Discharge patient to home (via wheelchair).                           - Soft diet today.                           - Continue present  medications.                           - Use Aciphex (rabeprazole) 20 mg PO BID until                            further notice.                           - Repeat upper endosocpy in 6-8 weeks to: (A)                            evaluate the response to therapy of the reflux                            esophagitis and (B) assess for need further                            esophageal dilatation and (C) biopsy Barrett's                            appearing esophagus (not done now, as hard to                            interpret for dysphagia in face of active reflux                            esophagitis.                           - Return to GI clinic after studies are complete.                           - Return to referring physician as previously                            scheduled. Procedure Code(s):        --- Professional ---                           [phone],  Esophagogastroduodenoscopy, flexible,                            transoral; with transendoscopic balloon dilation of                            esophagus (less than 30 mm diameter) Diagnosis Code(s):        --- Professional ---                           K22.2, Esophageal obstruction                           K21.0, Gastro-esophageal reflux disease with  esophagitis                           K22.8, Other specified diseases of esophagus                           K44.9, Diaphragmatic hernia without obstruction or                            gangrene                           R13.10, Dysphagia, unspecified                           R93.3, Abnormal findings on diagnostic imaging of                            other parts of digestive tract CPT copyright 2019 [employer]. All rights reserved. The codes documented in this report are preliminary and upon coder review may  be revised to meet current compliance requirements. Willis Modena, MD 10/10/2018 9:06:15 AM This report has been signed electronically. Number of Addenda: 0

## 2018-10-10 NOTE — Transfer of Care (Signed)
Immediate Anesthesia Transfer of Care Note  Patient: Eugene Davis  Procedure(s) Performed: Procedure(s): ESOPHAGOGASTRODUODENOSCOPY (EGD) WITH PROPOFOL  with  fluoroscopy (N/A) BALLOON DILATION (N/A)  Patient Location: Endoscopy Unit  Anesthesia Type:MAC  Level of Consciousness:  sedated, patient cooperative and responds to stimulation  Airway & Oxygen Therapy:Patient Spontanous Breathing and Patient connected to face mask oxgen  Post-op Assessment:  Report given to PACU RN and Post -op Vital signs reviewed and stable  Post vital signs:  Reviewed and stable  Last Vitals:  Vitals:   10/10/18 0729  BP: (!) 147/85  Pulse: 69  Resp: 10  Temp: 37 C  SpO2: 68%    Complications: No apparent anesthesia complications

## 2018-10-10 NOTE — Anesthesia Postprocedure Evaluation (Signed)
Anesthesia Post Note  Patient: Eugene Davis  Procedure(s) Performed: ESOPHAGOGASTRODUODENOSCOPY (EGD) WITH PROPOFOL  with  fluoroscopy (N/A ) BALLOON DILATION (N/A )     Patient location during evaluation: Endoscopy Anesthesia Type: MAC Level of consciousness: awake and alert Pain management: pain level controlled Vital Signs Assessment: post-procedure vital signs reviewed and stable Respiratory status: spontaneous breathing, nonlabored ventilation, respiratory function stable and patient connected to nasal cannula oxygen Cardiovascular status: blood pressure returned to baseline and stable Postop Assessment: no apparent nausea or vomiting Anesthetic complications: no    Last Vitals:  Vitals:   10/10/18 0920 10/10/18 0925  BP: (!) 146/92 (!) 147/96  Pulse: 77 85  Resp: 17 17  Temp:    SpO2: 97% 99%    Last Pain:  Vitals:   10/10/18 0920  TempSrc:   PainSc: 0-No pain                 Rianne Degraaf DANIEL

## 2018-10-10 NOTE — Discharge Instructions (Signed)
Endoscopy Care After Please read the instructions outlined below and refer to this sheet in the next few weeks. These discharge instructions provide you with general information on caring for yourself after you leave the hospital. Your doctor may also give you specific instructions. While your treatment has been planned according to the most current medical practices available, unavoidable complications occasionally occur. If you have any problems or questions after discharge, please call Dr. Paulita Fujita [facility]) at [phone].  HOME CARE INSTRUCTIONS Activity  You may resume your regular activity but move at a slower pace for the next 24 hours.   Take frequent rest periods for the next 24 hours.   Walking will help expel (get rid of) the air and reduce the bloated feeling in your abdomen.   No driving for 24 hours (because of the anesthesia (medicine) used during the test).   You may shower.   Do not sign any important legal documents or operate any machinery for 24 hours (because of the anesthesia used during the test).  Nutrition  Drink plenty of fluids.   Soft diet today, advance as tolerated thereafter.  Be careful and eat slowly, take small bites, minimize raw/fibrous foods such as french breads, steak/chicken, raw fruits/vegetables.  Begin with a light meal and progress to your normal diet.   Avoid alcoholic beverages for 24 hours or as instructed by your caregiver.  Medications You may resume your normal medications unless your caregiver tells you otherwise. What you can expect today  You may experience abdominal discomfort such as a feeling of fullness or "gas" pains.   You may experience a sore throat for 2 to 3 days. This is normal. Gargling with salt water may help this.    SEEK IMMEDIATE MEDICAL CARE IF:  You have excessive nausea (feeling sick to your stomach) and/or vomiting.   You have severe abdominal pain and distention (swelling).   You have  trouble swallowing.   You have a temperature over 100 F (37.8 C).   You have rectal bleeding or vomiting of blood.  Document Released: 08/04/2003 Document Revised: 09/01/2010 Document Reviewed: 02/14/2007 [facility] Patient Information 2012 [employer].

## 2018-10-11 ENCOUNTER — Encounter (HOSPITAL_COMMUNITY): Payer: Self-pay | Admitting: Gastroenterology

## 2018-10-12 ENCOUNTER — Other Ambulatory Visit: Payer: Self-pay | Admitting: Gastroenterology

## 2018-11-16 ENCOUNTER — Encounter (HOSPITAL_COMMUNITY): Payer: Self-pay

## 2018-11-17 ENCOUNTER — Other Ambulatory Visit (HOSPITAL_COMMUNITY)
Admission: RE | Admit: 2018-11-17 | Discharge: 2018-11-17 | Disposition: A | Discharge status: Home / Self Care | Payer: Managed Care, Other (non HMO) | Source: Ambulatory Visit | Attending: Gastroenterology | Admitting: Gastroenterology

## 2018-11-17 DIAGNOSIS — Z01812 Encounter for preprocedural laboratory examination: Secondary | ICD-10-CM | POA: Diagnosis present

## 2018-11-17 DIAGNOSIS — Z20828 Contact with and (suspected) exposure to other viral communicable diseases: Secondary | ICD-10-CM | POA: Insufficient documentation

## 2018-11-18 LAB — NOVEL CORONAVIRUS, NAA (HOSP ORDER, SEND-OUT TO REF LAB; TAT 18-24 HRS): SARS-CoV-2, NAA: NOT DETECTED

## 2018-11-20 NOTE — Anesthesia Preprocedure Evaluation (Addendum)
Anesthesia Evaluation  Patient identified by MRN, date of birth, ID band Patient awake    Reviewed: Allergy & Precautions, NPO status , Patient's Chart, lab work & pertinent test results  History of Anesthesia Complications Negative for: history of anesthetic complications  Airway Mallampati: II  TM Distance: >3 FB Neck ROM: Full    Dental  (+) Dental Advisory Given, Teeth Intact   Pulmonary neg pulmonary ROS,    Pulmonary exam normal        Cardiovascular negative cardio ROS Normal cardiovascular exam     Neuro/Psych  CRPS   Neuromuscular disease negative psych ROS   GI/Hepatic Neg liver ROS, GERD  Medicated and Controlled, Dysphagia    Endo/Other  negative endocrine ROS  Renal/GU negative Renal ROS     Musculoskeletal negative musculoskeletal ROS (+)   Abdominal   Peds  Hematology negative hematology ROS (+)   Anesthesia Other Findings   Reproductive/Obstetrics                            Anesthesia Physical Anesthesia Plan  ASA: III  Anesthesia Plan: MAC   Post-op Pain Management:    Induction: Intravenous  PONV Risk Score and Plan: 1 and Propofol infusion and Treatment may vary due to age or medical condition  Airway Management Planned: Nasal Cannula and Natural Airway  Additional Equipment: None  Intra-op Plan:   Post-operative Plan:   Informed Consent: I have reviewed the patients History and Physical, chart, labs and discussed the procedure including the risks, benefits and alternatives for the proposed anesthesia with the patient or authorized representative who has indicated his/her understanding and acceptance.       Plan Discussed with: CRNA and Anesthesiologist  Anesthesia Plan Comments:        Anesthesia Quick Evaluation

## 2018-11-21 ENCOUNTER — Ambulatory Visit (HOSPITAL_COMMUNITY)
Admission: RE | Admit: 2018-11-21 | Discharge: 2018-11-21 | Disposition: A | Discharge status: Home / Self Care | Payer: Managed Care, Other (non HMO) | Attending: Gastroenterology | Admitting: Gastroenterology

## 2018-11-21 ENCOUNTER — Encounter (HOSPITAL_COMMUNITY): Payer: Self-pay | Admitting: Certified Registered Nurse Anesthetist

## 2018-11-21 ENCOUNTER — Encounter (HOSPITAL_COMMUNITY)
Admission: RE | Disposition: A | Discharge status: Home / Self Care | Payer: Self-pay | Source: Home / Self Care | Attending: Gastroenterology

## 2018-11-21 ENCOUNTER — Ambulatory Visit (HOSPITAL_COMMUNITY): Payer: Managed Care, Other (non HMO) | Admitting: Anesthesiology

## 2018-11-21 ENCOUNTER — Other Ambulatory Visit: Payer: Self-pay

## 2018-11-21 DIAGNOSIS — K2270 Barrett's esophagus without dysplasia: Secondary | ICD-10-CM | POA: Diagnosis present

## 2018-11-21 DIAGNOSIS — K449 Diaphragmatic hernia without obstruction or gangrene: Secondary | ICD-10-CM | POA: Diagnosis not present

## 2018-11-21 DIAGNOSIS — K219 Gastro-esophageal reflux disease without esophagitis: Secondary | ICD-10-CM | POA: Insufficient documentation

## 2018-11-21 DIAGNOSIS — R1310 Dysphagia, unspecified: Secondary | ICD-10-CM | POA: Diagnosis not present

## 2018-11-21 DIAGNOSIS — K222 Esophageal obstruction: Secondary | ICD-10-CM | POA: Diagnosis not present

## 2018-11-21 HISTORY — PX: BALLOON DILATION: SHX5330

## 2018-11-21 HISTORY — PX: BIOPSY: SHX5522

## 2018-11-21 HISTORY — PX: ESOPHAGOGASTRODUODENOSCOPY (EGD) WITH PROPOFOL: SHX5813

## 2018-11-21 SURGERY — ESOPHAGOGASTRODUODENOSCOPY (EGD) WITH PROPOFOL
Anesthesia: Monitor Anesthesia Care

## 2018-11-21 MED ORDER — SODIUM CHLORIDE 0.9 % IV SOLN: INTRAVENOUS | Status: DC

## 2018-11-21 MED ORDER — PROPOFOL 10 MG/ML IV BOLUS
INTRAVENOUS | Status: DC | PRN
  Administered 2018-11-21: 50 mg via INTRAVENOUS

## 2018-11-21 MED ORDER — LIDOCAINE HCL (CARDIAC) PF 100 MG/5ML IV SOSY
PREFILLED_SYRINGE | INTRAVENOUS | Status: DC | PRN
  Administered 2018-11-21: 100 mg via INTRAVENOUS

## 2018-11-21 MED ORDER — PROPOFOL 500 MG/50ML IV EMUL
INTRAVENOUS | Status: DC | PRN
  Administered 2018-11-21: 150 ug/kg/min via INTRAVENOUS

## 2018-11-21 MED ORDER — PROPOFOL 500 MG/50ML IV EMUL
INTRAVENOUS | Status: AC
  Filled 2018-11-21: qty 250

## 2018-11-21 MED ORDER — LACTATED RINGERS IV SOLN
INTRAVENOUS | Status: DC
  Administered 2018-11-21: 08:00:00 via INTRAVENOUS

## 2018-11-21 SURGICAL SUPPLY — 15 items

## 2018-11-21 NOTE — Anesthesia Postprocedure Evaluation (Signed)
Anesthesia Post Note  Patient: Eugene Davis  Procedure(s) Performed: ESOPHAGOGASTRODUODENOSCOPY (EGD) WITH PROPOFOL (N/A ) BALLOON DILATION (N/A ) BIOPSY     Patient location during evaluation: PACU Anesthesia Type: MAC Level of consciousness: awake and alert Pain management: pain level controlled Vital Signs Assessment: post-procedure vital signs reviewed and stable Respiratory status: spontaneous breathing, nonlabored ventilation and respiratory function stable Cardiovascular status: stable and blood pressure returned to baseline Anesthetic complications: no    Last Vitals:  Vitals:   11/21/18 0920 11/21/18 0930  BP: (!) 119/53 114/68  Pulse: 76 89  Resp: 10 14  Temp:    SpO2: 100% 96%    Last Pain:  Vitals:   11/21/18 0930  TempSrc:   PainSc: 0-No pain                 Audry Pili

## 2018-11-21 NOTE — H&P (Signed)
Patient interval history reviewed.  Patient examined again.  There has been no change from documented H/P (scanned into chart from our office) except as documented below.  Assessment:  1.  Dysphagia:  Esophageal stricture. 2.  Suspected Barrett's esophagus. 3.  GERD.  Plan:  1.  Endoscopy with likely further esophageal dilatation and likely esophageal biopsies. 2.  Risks (bleeding, infection, bowel perforation that could require surgery, sedation-related changes in cardiopulmonary systems), benefits (identification and possible treatment of source of symptoms, exclusion of certain causes of symptoms), and alternatives (watchful waiting, radiographic imaging studies, empiric medical treatment) of upper endoscopy (EGD +/- DIL) were explained to patient/family in detail and patient wishes to proceed.

## 2018-11-21 NOTE — Discharge Instructions (Signed)

## 2018-11-21 NOTE — Transfer of Care (Signed)
Immediate Anesthesia Transfer of Care Note  Patient: Eugene Davis  Procedure(s) Performed: ESOPHAGOGASTRODUODENOSCOPY (EGD) WITH PROPOFOL (N/A ) BALLOON DILATION (N/A ) BIOPSY  Patient Location: PACU and Endoscopy Unit  Anesthesia Type:MAC  Level of Consciousness: drowsy  Airway & Oxygen Therapy: Patient Spontanous Breathing and Patient connected to face mask oxygen  Post-op Assessment: Report given to RN and Post -op Vital signs reviewed and stable  Post vital signs: Reviewed and stable  Last Vitals:  Vitals Value Taken Time  BP 116/48 11/21/18 0915  Temp 36.4 C 11/21/18 0915  Pulse 82 11/21/18 0918  Resp 9 11/21/18 0918  SpO2 99 % 11/21/18 0918  Vitals shown include unvalidated device data.  Last Pain:  Vitals:   11/21/18 0915  TempSrc: Temporal  PainSc: Asleep         Complications: No apparent anesthesia complications

## 2018-11-21 NOTE — Op Note (Signed)
[facility] Patient Name: Eugene Davis Procedure Date: 11/21/2018 MRN: [id number] Attending MD: Willis Modena , MD Date of Birth: 1970-04-30 CSN: [id number] Age: 48 Admit Type: Outpatient Procedure:                Upper GI endoscopy Indications:              Dysphagia, Follow-up of Barrett's esophagus,                            Follow-up of esophageal stenosis, For therapy of                            esophageal stenosis Providers:                Willis Modena, MD, Dwain Sarna, RN, Beryle Beams, Technician, Stephanie [place], CRNA Referring MD:              Medicines:                Monitored Anesthesia Care Complications:            No immediate complications. Estimated Blood Loss:     Estimated blood loss was minimal. Procedure:                Pre-Anesthesia Assessment:                           - Prior to the procedure, a History and Physical                            was performed, and patient medications and                            allergies were reviewed. The patient's tolerance of                            previous anesthesia was also reviewed. The risks                            and benefits of the procedure and the sedation                            options and risks were discussed with the patient.                            All questions were answered, and informed consent                            was obtained. Prior Anticoagulants: The patient has                            taken no previous anticoagulant or antiplatelet                            agents.  ASA Grade Assessment: III - A patient with                            severe systemic disease. After reviewing the risks                            and benefits, the patient was deemed in                            satisfactory condition to undergo the procedure.                           After obtaining informed consent, the endoscope was        passed under direct vision. Throughout the                            procedure, the patient's blood pressure, pulse, and                            oxygen saturations were monitored continuously. The                            GIF-H190 ([id number] Olympus gastroscope was                            introduced through the mouth, and advanced to the                            second part of duodenum. The upper GI endoscopy was                            accomplished without difficulty. The patient                            tolerated the procedure well. Scope In: Scope Out: Findings:      There were esophageal mucosal changes suspicious for long-segment       Barrett's esophagus present in the upper third of the esophagus, in the       middle third of the esophagus and in the lower third of the esophagus,       extending circumferentially from 28 to 40 cm from the incisors; no       mucosal nodularity, ulceration or mass seen. Mucosa was biopsied with a       cold forceps for histology in 4 quadrants at intervals of 3 cm; total of       4 specimen bottles (from incisors: 29cm, 32cm, 35cm, 38cm) were sent to       pathology. Barrett's from 28 to 40 cm from incisors. GE junction 40 cm       from incisors.      One benign-appearing, intrinsic mild stenosis was found, much improved       appearance from prior endoscopy; no residual esophagitis was seen. A TTS       dilator was passed through the scope. Dilation with a 12-13.5-15 mm       balloon dilator was performed  to 13.5 mm.      The exam of the esophagus was otherwise normal.      A medium-sized hiatal hernia was present, from 40 to 44cm from the       incisors.      The exam of the stomach was otherwise normal.      The duodenal bulb, first portion of the duodenum and second portion of       the duodenum were normal. Impression:               - Esophageal mucosal changes suspicious for                            long-segment  Barrett's esophagus. [id number], biopsied.                           - Benign-appearing esophageal stenosis. Dilated.                           - Medium-sized hiatal hernia.                           - Normal duodenal bulb, first portion of the                            duodenum and second portion of the duodenum. Moderate Sedation:      Not Applicable - Patient had care per Anesthesia. Recommendation:           - Discharge patient to home (via wheelchair).                           - Soft diet today.                           - Continue present medications. Patient can                            decrease PPI to once-a-day, but will need lifelong                            PPI therapy.                           - Await pathology results.                           - Return to GI clinic in 2 months.                           - Return to referring physician as previously                            scheduled. Procedure Code(s):        --- Professional ---                           [phone], Esophagogastroduodenoscopy, flexible,  transoral; with transendoscopic balloon dilation of                            esophagus (less than 30 mm diameter)                           43239, 59, Esophagogastroduodenoscopy, flexible,                            transoral; with biopsy, single or multiple Diagnosis Code(s):        --- Professional ---                           K22.70, Barrett's esophagus without dysplasia                           K22.2, Esophageal obstruction                           K44.9, Diaphragmatic hernia without obstruction or                            gangrene                           R13.10, Dysphagia, unspecified CPT copyright 2019 American Medical Association. All rights reserved. The codes documented in this report are preliminary and upon coder review may  be revised to meet current compliance requirements. Willis ModenaWilliam Arren Laminack, MD 11/21/2018 9:18:36 AM This  report has been signed electronically. Number of Addenda: 0

## 2018-11-22 ENCOUNTER — Encounter (HOSPITAL_COMMUNITY): Payer: Self-pay | Admitting: Gastroenterology

## 2018-11-22 LAB — SURGICAL PATHOLOGY

## 2018-12-20 ENCOUNTER — Ambulatory Visit: Payer: Managed Care, Other (non HMO) | Attending: Internal Medicine

## 2018-12-20 DIAGNOSIS — Z20822 Contact with and (suspected) exposure to covid-19: Secondary | ICD-10-CM

## 2018-12-22 LAB — NOVEL CORONAVIRUS, NAA: SARS-CoV-2, NAA: NOT DETECTED

## 2019-09-20 NOTE — Progress Notes (Signed)
.  Cardiology Office Note:    Date:  09/26/2019   ID:  Eugene Davis, DOB Nov 24, 1970, MRN [id number]  PCP:  [facility] @ [facility] Cardiologist:  Meriam Sprague, MD  [facility] Electrophysiologist:  None   Referring MD: Jarrett Soho, PA-C    History of Present Illness:    Eugene Davis is a 49 y.o. 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  Note from Jarrett Soho dated 09/16/19 reviewed. Patient presented with "jitteriness" and fluttering in his chest that occurred over the weekend after mowing the yard. Had some associated facial and hand tingling. Felt like his heart was "beating faster." No associated SOB. He states that his symptoms of "jitteriness" and just needing to move lasted about 1 week until they finally went away. No associated chest pain, tightness, nausea. Had some "hot flashes" that he thought was due to a rash he developed on his skin. Never had symptoms like this before and symptoms have not recurred. No exertional symptoms. He cycles 1hour per day without issues. Able to walk 1 mile and mow the yard without difficulty.   Family history notable for mother with hypertension and stroke at age 70.   Labs 09/16/19: Cr 0.89, AST 27, ALT 27, TSH 1.190, HgB 15.5. ECG dated 09/13/19 NSR with HR 61 with no ischemic changes   Past Medical History:  Diagnosis Date  . Allergic rhinitis   . Barrett esophagus   . Bee sting allergy   . Complex regional pain syndrome   . Complex regional pain syndrome I   . Dysphagia   . Esophageal stricture   . Family history of GI malignancy   . GERD (gastroesophageal reflux disease)   . Hx of colonic polyps   . Metacarpal bone fracture    left 5th  . Palpitation     Past Surgical History:  Procedure Laterality Date  . BALLOON DILATION N/A 10/10/2018    Procedure: BALLOON DILATION;  Surgeon: Willis Modena, MD;  Location: [facility];  Service: Endoscopy;  Laterality: N/A;  . BALLOON DILATION N/A 11/21/2018   Procedure: BALLOON DILATION;  Surgeon: Willis Modena, MD;  Location: [facility];  Service: Endoscopy;  Laterality: N/A;  . BIOPSY  11/21/2018   Procedure: BIOPSY;  Surgeon: Willis Modena, MD;  Location: [facility];  Service: Endoscopy;;  . COLONOSCOPY     polypecotomy , benign per patient   . ESOPHAGOGASTRODUODENOSCOPY (EGD) WITH PROPOFOL N/A 10/10/2018   Procedure: ESOPHAGOGASTRODUODENOSCOPY (EGD) WITH PROPOFOL  with  fluoroscopy;  Surgeon: Willis Modena, MD;  Location: [facility];  Service: Endoscopy;  Laterality: N/A;  . ESOPHAGOGASTRODUODENOSCOPY (EGD) WITH PROPOFOL N/A 11/21/2018   Procedure: ESOPHAGOGASTRODUODENOSCOPY (EGD) WITH PROPOFOL;  Surgeon: Willis Modena, MD;  Location: [facility];  Service: Endoscopy;  Laterality: N/A;  . KNEE SURGERY Right in high school  . OPEN REDUCTION INTERNAL FIXATION (ORIF) METACARPAL Left 07/12/2016   Procedure: OPEN TREATMENT OF LEFT 5TH METACARPAL BASE FRACTURE;  Surgeon: Mack Hook, MD;  Location: [facility];  Service: Orthopedics;  Laterality: Left;  . WRIST SURGERY  age 30    Current Medications: Current Meds  Medication Sig  . acetaminophen (TYLENOL) 325 MG tablet Take 2 tablets (650 mg total) by mouth every 6 (six) hours as needed for mild pain or moderate pain.  . Ascorbic Acid (VITAMIN C) 500 MG  PACK Take 1 Package by mouth daily.  . fluticasone (FLONASE) 50 MCG/ACT nasal spray Place 1 spray into both nostrils daily.   Gerrianne Scale (MSM) POWD Take 1 Package by mouth daily.  Marland Kitchen omeprazole (PRILOSEC) 40 MG capsule Take 40 mg by mouth daily.   . Sodium Chloride-Xylitol (XLEAR SINUS CARE SPRAY NA) Place 1 spray into the nose daily as needed (nasal congestion).  . Wheat Dextrin (BENEFIBER) POWD Take 1 Scoop by mouth daily as needed (fiber).      Allergies:   Other   Social History   Socioeconomic History  . Marital status: Single    Spouse name: Not on file  . Number of children: Not on file  . Years of education: Not on file  . Highest education level: Not on file  Occupational History  . Not on file  Tobacco Use  . Smoking status: Never Smoker  . Smokeless tobacco: Never Used  Substance and Sexual Activity  . Alcohol use: Yes    Comment: social  . Drug use: No  . Sexual activity: Not on file  Other Topics Concern  . Not on file  Social History Narrative  . Not on file   Social Determinants of Health   Financial Resource Strain:   . Difficulty of Paying Living Expenses: Not on file  Food Insecurity:   . Worried About Programme researcher, broadcasting/film/video in the Last Year: Not on file  . Ran Out of Food in the Last Year: Not on file  Transportation Needs:   . Lack of Transportation (Medical): Not on file  . Lack of Transportation (Non-Medical): Not on file  Physical Activity:   . Days of Exercise per Week: Not on file  . Minutes of Exercise per Session: Not on file  Stress:   . Feeling of Stress : Not on file  Social Connections:   . Frequency of Communication with Friends and Family: Not on file  . Frequency of Social Gatherings with Friends and Family: Not on file  . Attends Religious Services: Not on file  . Active Member of Clubs or Organizations: Not on file  . Attends Banker Meetings: Not on file  . Marital Status: Not on file     Family History: The patient's family history includes Cancer - Colon in his father.  Mother-history of stroke and hypertension  ROS:   Please see the history of present illness.    All other systems reviewed and are negative.  EKGs/Labs/Other Studies Reviewed:    The following studies were reviewed today:  ECG 09/13/19: NSR with HR 61; no ischemic changes  Cr 0.89, AST 27, ALT 27, TSH 1.190, HgB 15.5.  EKG:  EKG is ordered today.  The ekg ordered today  demonstrates NSR with HR 65  Recent Labs: No results found for requested labs within last 8760 hours.  Recent Lipid Panel No results found for: CHOL, TRIG, HDL, CHOLHDL, VLDL, LDLCALC, LDLDIRECT  Physical Exam:    VS:  BP 130/90   Pulse 65   Ht 6\' 1"  (1.854 m)   Wt 260 lb (117.9 kg)   SpO2 97%   BMI 34.30 kg/m     Wt Readings from Last 3 Encounters:  09/26/19 260 lb (117.9 kg)  11/21/18 250 lb (113.4 kg)  10/10/18 250 lb (113.4 kg)     GEN:  Well nourished, well developed in no acute distress HEENT: Normal NECK: No JVD; No carotid bruits LYMPHATICS: No lymphadenopathy CARDIAC: RRR, no murmurs,  rubs, gallops RESPIRATORY:  Clear to auscultation without rales, wheezing or rhonchi  ABDOMEN: Soft, non-tender, non-distended MUSCULOSKELETAL:  No edema; No deformity  SKIN: Warm and dry NEUROLOGIC:  Alert and oriented x 3 PSYCHIATRIC:  Normal affect   ASSESSMENT:    1. Chest pain of uncertain etiology    PLAN:    In order of problems listed above:  # Palpitations/jitteriness Had episode of jitteriness that lasted a week after mowing the lawn. Symptoms actually developed when he was sitting at his computer about 1 hour after mowing the lawn. The symptoms lasted approximately 1 week without abating. No associated chest pain, SOB, nausea or vomiting. Had a spider bite and believed this was causing him significant anxiety. No recurrence of symptoms. No exertional symptoms. While he had fluttering briefly, no palpitations after that initial event. Currently symptom free. -Does not sound cardiac in nature. ECG reassuring. Patient asymptomatic now -TSH normal, Hgb normal -Offered event monitor today, but given symptoms resolved and have not recurred, will defer for now unless symptoms develop again -Discussed warning signs of chest pain, palpitations, exertional symptoms and to call the office if this occurs  # Obesity: BMI 35 -Counseled about diet and exercise at length including  150min of moderate exercise per week and diet in lean meats, fruits, vegetables  #Prevention: -Check lipid panel today -Monitor blood pressures at home and keep a log for reviews   Medication Adjustments/Labs and Tests Ordered: Current medicines are reviewed at length with the patient today.  Concerns regarding medicines are outlined above.  Orders Placed This Encounter  Procedures  . Lipid panel  . EKG 12-Lead   No orders of the defined types were placed in this encounter.   Patient Instructions  Medication Instructions:  Your physician recommends that you continue on your current medications as directed. Please refer to the Current Medication list given to you today.   Labwork: You will have labs drawn today: Lipid Panel   Testing/Procedures: None ordered.  Follow-Up: Your physician recommends that you schedule a follow-up appointment in:   12 months with Dr. Shea EvansPembroke   Any Other Special Instructions Will Be Listed Below (If Applicable).   Nonspecific Chest Pain Chest pain can be caused by many different conditions. Some causes of chest pain can be life-threatening. These will require treatment right away. Serious causes of chest pain include:  Heart attack.  A tear in the body's main blood vessel.  Redness and swelling (inflammation) around your heart.  Blood clot in your lungs. Other causes of chest pain may not be so serious. These include:  Heartburn.  Anxiety or stress.  Damage to bones or muscles in your chest.  Lung infections. Chest pain can feel like:  Pain or discomfort in your chest.  Crushing, pressure, aching, or squeezing pain.  Burning or tingling.  Dull or sharp pain that is worse when you move, cough, or take a deep breath.  Pain or discomfort that is also felt in your back, neck, jaw, shoulder, or arm, or pain that spreads to any of these areas. It is hard to know whether your pain is caused by something that is serious or  something that is not so serious. So it is important to see your doctor right away if you have chest pain. Follow these instructions at home: Medicines  Take over-the-counter and prescription medicines only as told by your doctor.  If you were prescribed an antibiotic medicine, take it as told by your doctor. Do not stop  taking the antibiotic even if you start to feel better. Lifestyle   Rest as told by your doctor.  Do not use any products that contain nicotine or tobacco, such as cigarettes, e-cigarettes, and chewing tobacco. If you need help quitting, ask your doctor.  Do not drink alcohol.  Make lifestyle changes as told by your doctor. These may include: ? Getting regular exercise. Ask your doctor what activities are safe for you. ? Eating a heart-healthy diet. A diet and nutrition specialist (dietitian) can help you to learn healthy eating options. ? Staying at a healthy weight. ? Treating diabetes or high blood pressure, if needed. ? Lowering your stress. Activities such as yoga and relaxation techniques can help. General instructions  Pay attention to any changes in your symptoms. Tell your doctor about them or any new symptoms.  Avoid any activities that cause chest pain.  Keep all follow-up visits as told by your doctor. This is important. You may need more testing if your chest pain does not go away. Contact a doctor if:  Your chest pain does not go away.  You feel depressed.  You have a fever. Get help right away if:  Your chest pain is worse.  You have a cough that gets worse, or you cough up blood.  You have very bad (severe) pain in your belly (abdomen).  You pass out (faint).  You have either of these for no clear reason: ? Sudden chest discomfort. ? Sudden discomfort in your arms, back, neck, or jaw.  You have shortness of breath at any time.  You suddenly start to sweat, or your skin gets clammy.  You feel sick to your stomach (nauseous).  You  throw up (vomit).  You suddenly feel lightheaded or dizzy.  You feel very weak or tired.  Your heart starts to beat fast, or it feels like it is skipping beats. These symptoms may be an emergency. Do not wait to see if the symptoms will go away. Get medical help right away. Call your local emergency services (911 in the [place] Do not drive yourself to the hospital. Summary  Chest pain can be caused by many different conditions. The cause may be serious and need treatment right away. If you have chest pain, see your doctor right away.  Follow your doctor's instructions for taking medicines and making lifestyle changes.  Keep all follow-up visits as told by your doctor. This includes visits for any further testing if your chest pain does not go away.  Be sure to know the signs that show that your condition has become worse. Get help right away if you have these symptoms. This information is not intended to replace advice given to you by your health care provider. Make sure you discuss any questions you have with your health care provider. Document Revised: 06/22/2017 Document Reviewed: 06/22/2017 Elsevier Patient Education  [employer].    If you need a refill on your cardiac medications before your next appointment, please call your pharmacy.      Signed, Meriam Sprague, MD  09/26/2019 8:58 AM    [facility]

## 2019-09-26 ENCOUNTER — Encounter: Payer: Self-pay | Admitting: Cardiology

## 2019-09-26 ENCOUNTER — Other Ambulatory Visit: Payer: Self-pay

## 2019-09-26 ENCOUNTER — Ambulatory Visit (INDEPENDENT_AMBULATORY_CARE_PROVIDER_SITE_OTHER): Payer: Managed Care, Other (non HMO) | Admitting: Cardiology

## 2019-09-26 VITALS — BP 130/90 | HR 65 | Ht 73.0 in | Wt 260.0 lb

## 2019-09-26 DIAGNOSIS — R079 Chest pain, unspecified: Secondary | ICD-10-CM

## 2019-09-26 DIAGNOSIS — R002 Palpitations: Secondary | ICD-10-CM | POA: Diagnosis not present

## 2019-09-26 LAB — LIPID PANEL
Chol/HDL Ratio: 3.3 ratio (ref 0.0–5.0)
Cholesterol, Total: 152 mg/dL (ref 100–199)
HDL: 46 mg/dL (ref >39)
LDL Chol Calc (NIH): 70 mg/dL (ref 0–99)
Triglycerides: 218 mg/dL — ABNORMAL HIGH (ref 0–149)
VLDL Cholesterol Cal: 36 mg/dL (ref 5–40)

## 2019-09-26 NOTE — Patient Instructions (Signed)
Medication Instructions:  Your physician recommends that you continue on your current medications as directed. Please refer to the Current Medication list given to you today.   Labwork: You will have labs drawn today: Lipid Panel   Testing/Procedures: None ordered.  Follow-Up: Your physician recommends that you schedule a follow-up appointment in:   12 months with Dr. Shea Evans   Any Other Special Instructions Will Be Listed Below (If Applicable).   Nonspecific Chest Pain Chest pain can be caused by many different conditions. Some causes of chest pain can be life-threatening. These will require treatment right away. Serious causes of chest pain include:  Heart attack.  A tear in the body's main blood vessel.  Redness and swelling (inflammation) around your heart.  Blood clot in your lungs. Other causes of chest pain may not be so serious. These include:  Heartburn.  Anxiety or stress.  Damage to bones or muscles in your chest.  Lung infections. Chest pain can feel like:  Pain or discomfort in your chest.  Crushing, pressure, aching, or squeezing pain.  Burning or tingling.  Dull or sharp pain that is worse when you move, cough, or take a deep breath.  Pain or discomfort that is also felt in your back, neck, jaw, shoulder, or arm, or pain that spreads to any of these areas. It is hard to know whether your pain is caused by something that is serious or something that is not so serious. So it is important to see your doctor right away if you have chest pain. Follow these instructions at home: Medicines  Take over-the-counter and prescription medicines only as told by your doctor.  If you were prescribed an antibiotic medicine, take it as told by your doctor. Do not stop taking the antibiotic even if you start to feel better. Lifestyle   Rest as told by your doctor.  Do not use any products that contain nicotine or tobacco, such as cigarettes, e-cigarettes,  and chewing tobacco. If you need help quitting, ask your doctor.  Do not drink alcohol.  Make lifestyle changes as told by your doctor. These may include: ? Getting regular exercise. Ask your doctor what activities are safe for you. ? Eating a heart-healthy diet. A diet and nutrition specialist (dietitian) can help you to learn healthy eating options. ? Staying at a healthy weight. ? Treating diabetes or high blood pressure, if needed. ? Lowering your stress. Activities such as yoga and relaxation techniques can help. General instructions  Pay attention to any changes in your symptoms. Tell your doctor about them or any new symptoms.  Avoid any activities that cause chest pain.  Keep all follow-up visits as told by your doctor. This is important. You may need more testing if your chest pain does not go away. Contact a doctor if:  Your chest pain does not go away.  You feel depressed.  You have a fever. Get help right away if:  Your chest pain is worse.  You have a cough that gets worse, or you cough up blood.  You have very bad (severe) pain in your belly (abdomen).  You pass out (faint).  You have either of these for no clear reason: ? Sudden chest discomfort. ? Sudden discomfort in your arms, back, neck, or jaw.  You have shortness of breath at any time.  You suddenly start to sweat, or your skin gets clammy.  You feel sick to your stomach (nauseous).  You throw up (vomit).  You suddenly feel  lightheaded or dizzy.  You feel very weak or tired.  Your heart starts to beat fast, or it feels like it is skipping beats. These symptoms may be an emergency. Do not wait to see if the symptoms will go away. Get medical help right away. Call your local emergency services (911 in the [place] Do not drive yourself to the hospital. Summary  Chest pain can be caused by many different conditions. The cause may be serious and need treatment right away. If you have chest pain,  see your doctor right away.  Follow your doctor's instructions for taking medicines and making lifestyle changes.  Keep all follow-up visits as told by your doctor. This includes visits for any further testing if your chest pain does not go away.  Be sure to know the signs that show that your condition has become worse. Get help right away if you have these symptoms. This information is not intended to replace advice given to you by your health care provider. Make sure you discuss any questions you have with your health care provider. Document Revised: 06/22/2017 Document Reviewed: 06/22/2017 Elsevier Patient Education  [employer].    If you need a refill on your cardiac medications before your next appointment, please call your pharmacy.

## 2020-02-02 IMAGING — RF DG ESOPHAGUS
8 series · 14 of 24 positions shown · non-contrast
Comparison: None.

CLINICAL DATA: Food getting stuck in throat.

EXAM:
ESOPHOGRAM/BARIUM SWALLOW
TECHNIQUE: Combined double contrast and single contrast examination performed
using effervescent crystals, thick barium liquid, and thin barium
liquid.
FLUOROSCOPY TIME:  Fluoroscopy Time:  1 minutes and 48 seconds.
Radiation Exposure Index (if provided by the fluoroscopic device):
122 mGy
Number of Acquired Spot Images:

[Series 1: sequence · 0.28mm/px · 2 of 10 frames shown (1 of 8)]
[frame 2/10]
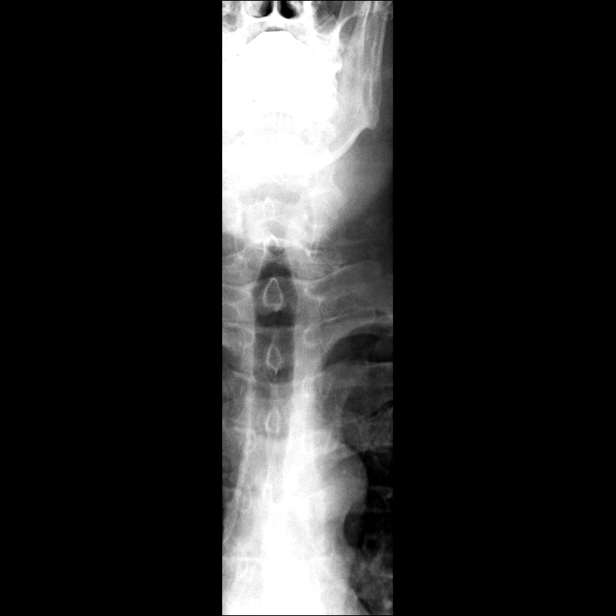
[frame 9/10]
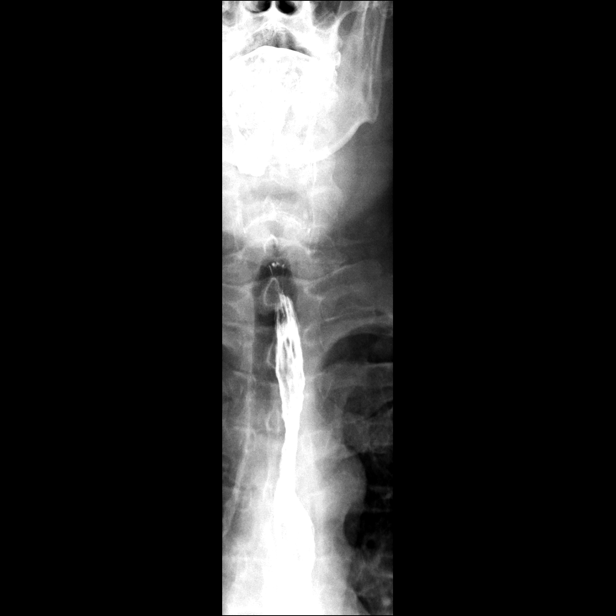

[Series 2: sequence · 0.28mm/px · 1 of 9 frames shown (2 of 8)]
[frame 6/9]
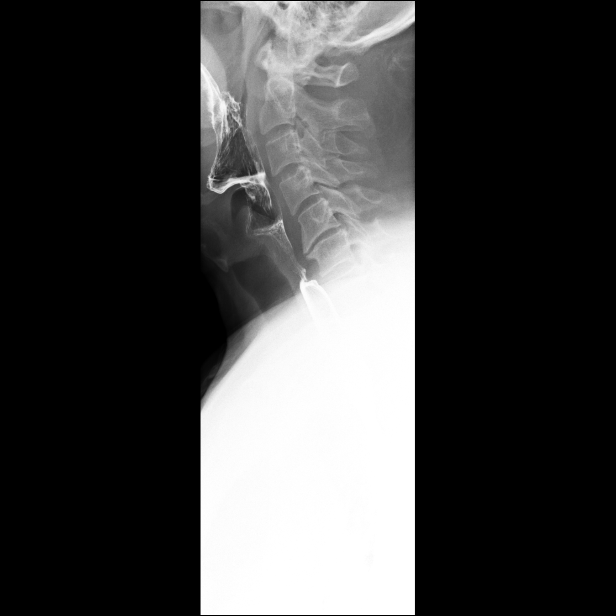

[Series 3: sequence · 0.28mm/px · 2 of 33 frames shown (3 of 8)]
[frame 5/33]
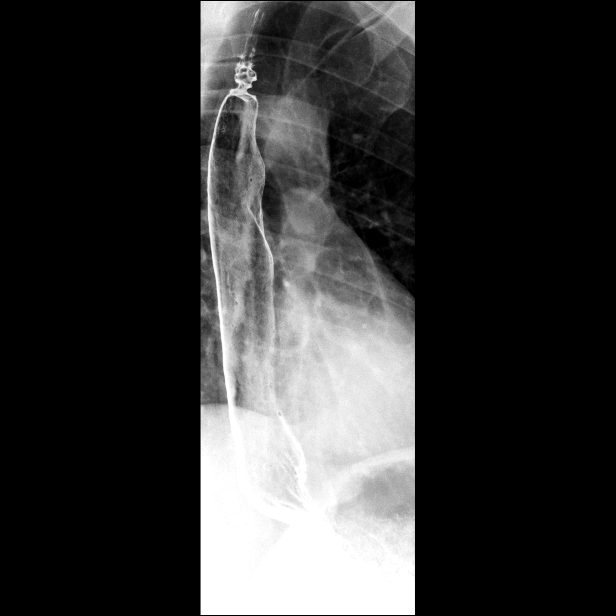
[frame 28/33]
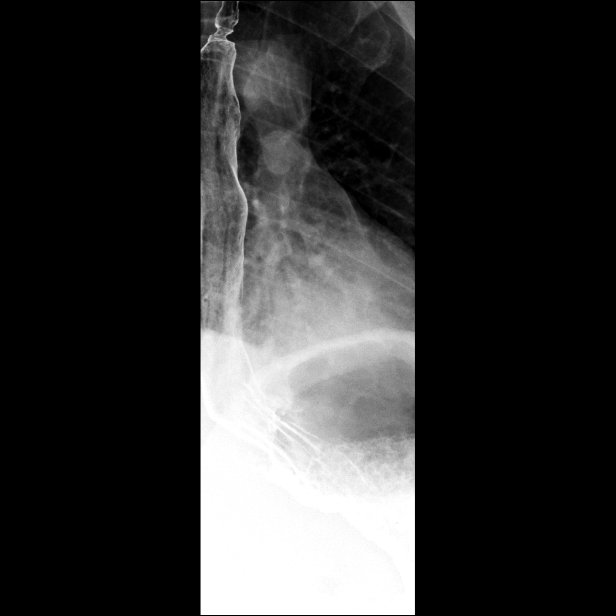

[Series 4: sequence · 2 of 43 frames shown (4 of 8)]
[frame 7/43]
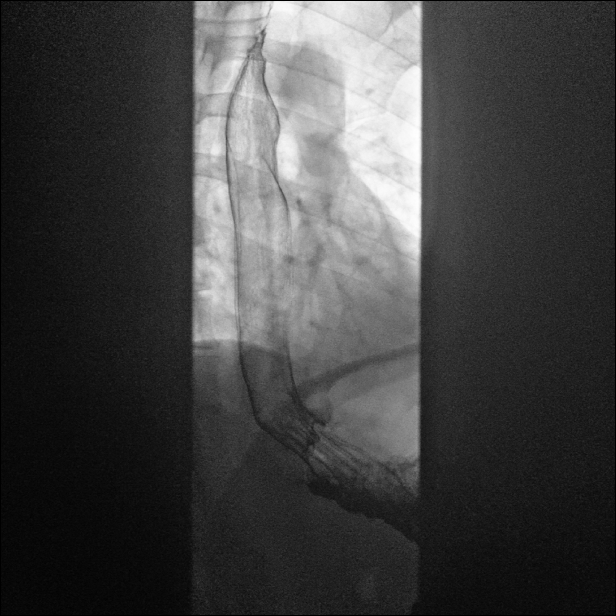
[frame 37/43]
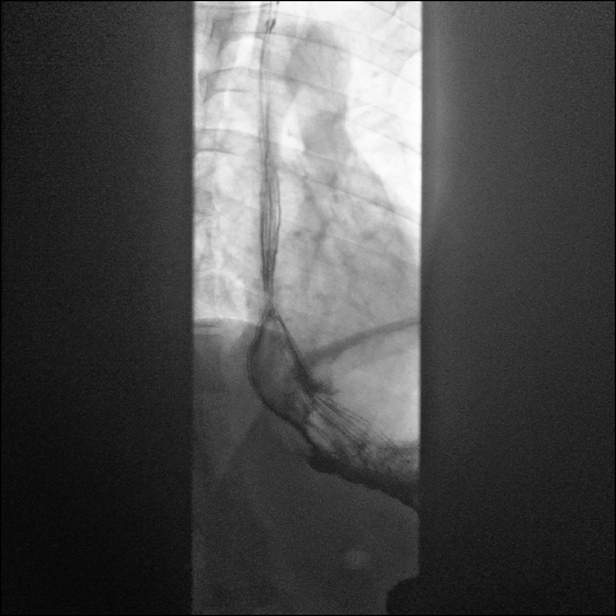

[Series 5: sequence · 2 of 43 frames shown (5 of 8)]
[frame 7/43]
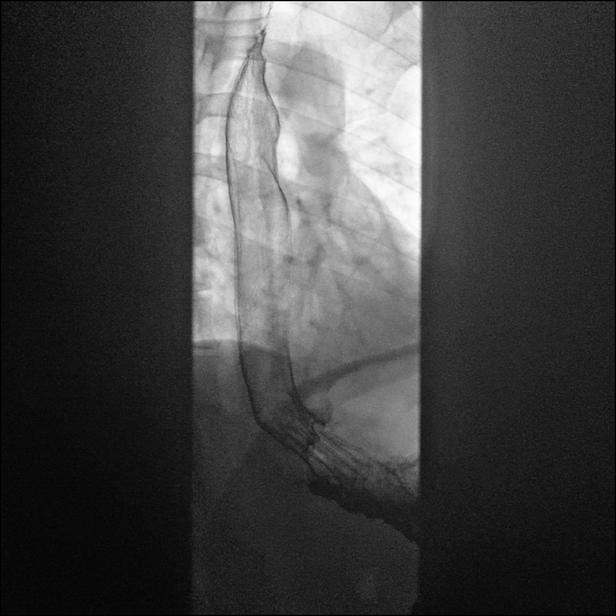
[frame 37/43]
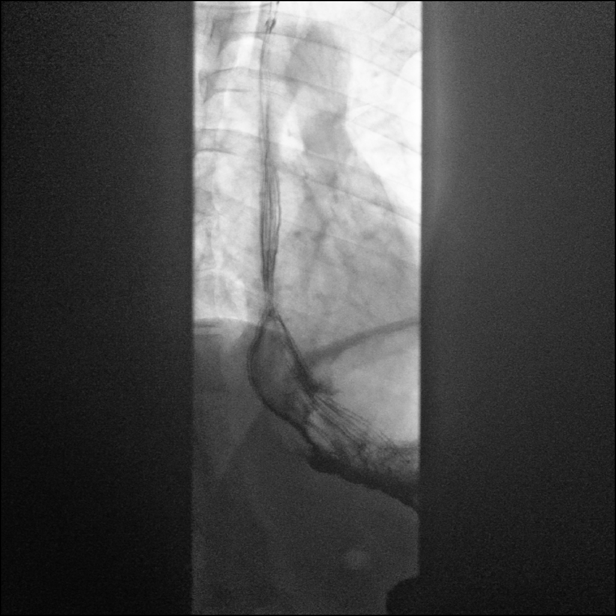

[Series 7: sequence · 1 of 71 frames shown (6 of 8)]
[frame 36/71]
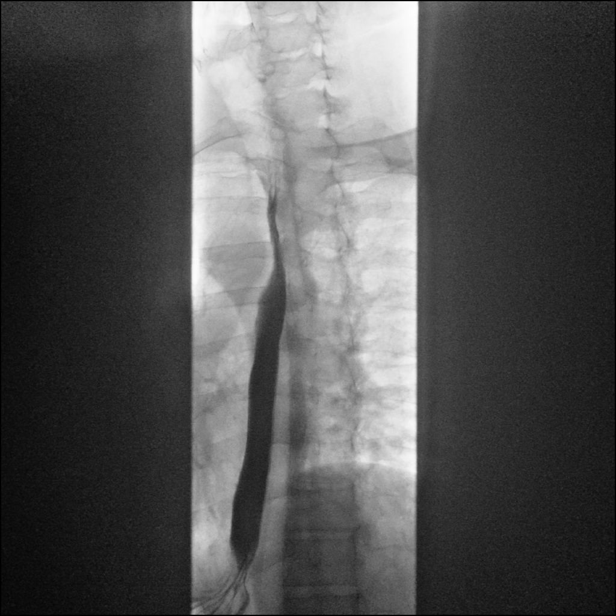

[Series 8: sequence · 2 of 29 frames shown (7 of 8)]
[frame 1/29]
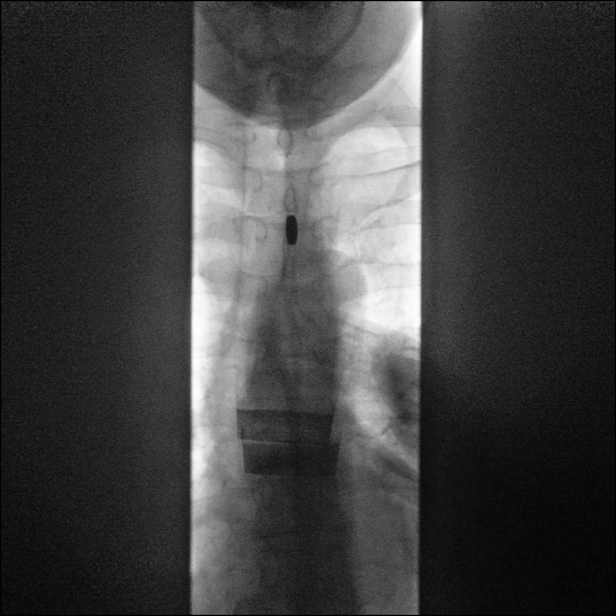
[frame 5/29]
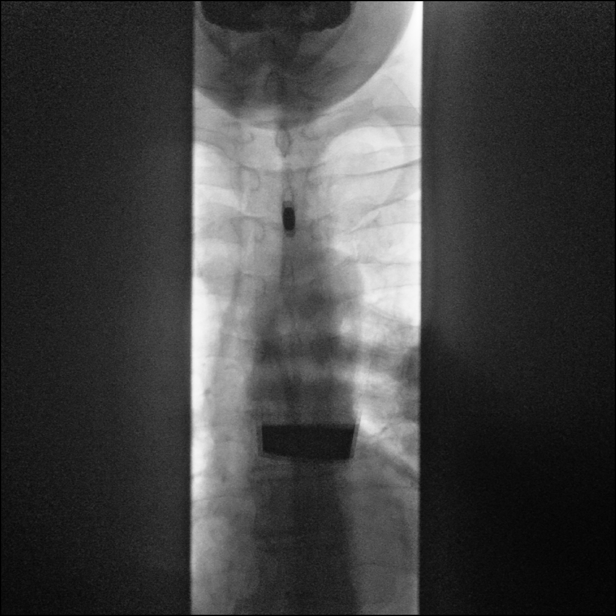

[Series 9: sequence · 2 of 51 frames shown (8 of 8)]
[frame 1/51]
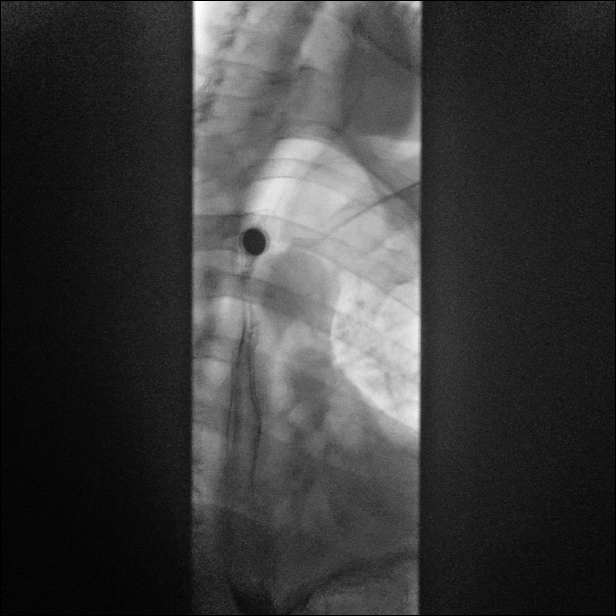
[frame 44/51]
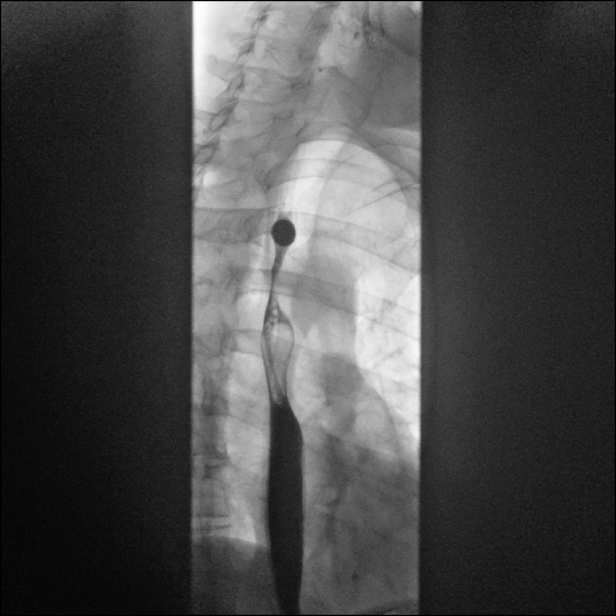

[14 of 24 positions shown; findings below may reference images not displayed]

FINDINGS: Frontal and lateral views of the hypopharynx while swallowing thick
barium are unremarkable.

Double contrast imaging of the esophagus shows a abrupt ring-like
stricture in the upper third of the esophagus at approximately the
level of the sternal notch. No associated mucosal irregularity or
mass-effect. Mid and distal esophagus unremarkable without evidence
for ulcer, diverticulum, or mass lesion.

Esophageal motility is normal with good preservation of primary
peristalsis. No tertiary contractions or presbyesophagus. Tiny
sliding type hiatal hernia noted.

13 mm barium tablet becomes lodged at the proximal esophageal
stricture despite repeated swallows of water and barium.
IMPRESSION: Abrupt ring-like stricture of the proximal esophagus obstructs
passage of a 13 mm barium tablet. Fluoroscopic appearance suggests
benign etiology without mucosal irregularity or mass-effect. Upper
endoscopy likely warranted to further evaluate.

## 2020-02-24 ENCOUNTER — Telehealth: Payer: Self-pay | Admitting: Cardiology

## 2020-02-24 NOTE — Telephone Encounter (Signed)
Pt c/o BP issue: STAT if pt c/o blurred vision, one-sided weakness or slurred speech  1. What are your last 5 BP readings? 160/95, states it stays around this.   2. Are you having any other symptoms (ex. Dizziness, headache, blurred vision, passed out)? Denied these symptoms  3. What is your BP issue? Elevated BP, scheduled him to see Dr. Shari Prows on 03/04/20 at 8:00am.

## 2020-02-24 NOTE — Telephone Encounter (Signed)
Returned call to Pt.  Because there are no pharmacy blood pressure cuffs because of the pandemic (to verify his blood pressure readings with home cuff)-will plan to have Pt take his blood pressure daily at random times throughout the day.  He will keep a log.  Will bring log and home blood pressure cuff to upcoming appt to validate blood pressure readings and determine if any treatment needed.  Pt indicates agreement with plan.  He has decided to start biking.  Encouraged Pt to start exercising.

## 2020-03-01 NOTE — Progress Notes (Signed)
Cardiology Office Note:    Date:  03/04/2020   ID:  Eugene Davis, DOB 11-Mar-1970, MRN [id number]  PCP:  Eugene Heron, MD   [facility]  Cardiologist:  Eugene Sprague, MD  Advanced Practice Provider:  No care team member to display Electrophysiologist:  None    Referring MD: Eugene Davis Family M*    History of Present Illness:    Eugene Davis is a 50 y.o. 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  During last visit on 09/26/19, the patient had been experiencing palpitations however they resolved by the time of the clinic visit. Event monitor deferred at that time. Was also having noncardiac chest pain which we were going to monitor.   Patient states that he overall has been feeling better. He is active and bikes on his stationary bike for 3-4x/week without symptoms. No nausea, vomiting, SOB, or lightheadedness. He is concerned that his blood pressure has been elevated with SBP 160s. Has been limiting his salt intake and decreased his caffeine intake, but it has remained elevated which has prompted him to come in for evaluation today.  Past Medical History:  Diagnosis Date  . Allergic rhinitis   . Barrett esophagus   . Bee sting allergy   . Complex regional pain syndrome   . Complex regional pain syndrome I   . Dysphagia   . Esophageal stricture   . Family history of GI malignancy   . GERD (gastroesophageal reflux disease)   . Hx of colonic polyps   . Metacarpal bone fracture    left 5th  . Palpitation     Past Surgical History:  Procedure Laterality Date  . BALLOON DILATION N/A 10/10/2018   Procedure: BALLOON DILATION;  Surgeon: Eugene Modena, MD;  Location: [facility];  Service: Endoscopy;  Laterality: N/A;  . BALLOON DILATION N/A 11/21/2018   Procedure: BALLOON DILATION;  Surgeon: Eugene Modena, MD;  Location: [facility];   Service: Endoscopy;  Laterality: N/A;  . BIOPSY  11/21/2018   Procedure: BIOPSY;  Surgeon: Eugene Modena, MD;  Location: [facility];  Service: Endoscopy;;  . COLONOSCOPY     polypecotomy , benign per patient   . ESOPHAGOGASTRODUODENOSCOPY (EGD) WITH PROPOFOL N/A 10/10/2018   Procedure: ESOPHAGOGASTRODUODENOSCOPY (EGD) WITH PROPOFOL  with  fluoroscopy;  Surgeon: Eugene Modena, MD;  [facility]: [facility];  Service: Endoscopy;  Laterality: N/A;  . ESOPHAGOGASTRODUODENOSCOPY (EGD) WITH PROPOFOL N/A 11/21/2018   Procedure: ESOPHAGOGASTRODUODENOSCOPY (EGD) WITH PROPOFOL;  Surgeon: Eugene Modena, MD;  Location: [facility];  Service: Endoscopy;  Laterality: N/A;  . KNEE SURGERY Right in high school  . OPEN REDUCTION INTERNAL FIXATION (ORIF) METACARPAL Left 07/12/2016   Procedure: OPEN TREATMENT OF LEFT 5TH METACARPAL BASE FRACTURE;  Surgeon: Eugene Hook, MD;  Location: [facility];  Service: Orthopedics;  Laterality: Left;  . WRIST SURGERY  age 24    Current Medications: Current Meds  Medication Sig  . acetaminophen (TYLENOL) 325 MG tablet Take 2 tablets (650 mg total) by mouth every 6 (six) hours as needed for mild pain or moderate pain.  Marland Kitchen amLODipine (NORVASC) 5 MG tablet Take 1 tablet (5 mg total) by mouth daily.  . Ascorbic Acid (VITAMIN C) 500 MG PACK Take 1 Package by mouth daily.  . fluticasone (FLONASE) 50 MCG/ACT nasal spray Place 1 spray into both nostrils daily.   . Methylsulfonylmethane (MSM) POWD  Take 1 Package by mouth daily.  Marland Kitchen omeprazole (PRILOSEC) 40 MG capsule Take 40 mg by mouth daily.   . Sodium Chloride-Xylitol (XLEAR SINUS CARE SPRAY NA) Place 1 spray into the nose daily as needed (nasal congestion).  . Wheat Dextrin (BENEFIBER) POWD Take 1 Scoop by mouth daily as needed (fiber).     Allergies:   Other   Social History   Socioeconomic History  . Marital status: Single    Spouse name: Not on file  . Number of children: Not on file  . Years  of education: Not on file  . Highest education level: Not on file  Occupational History  . Not on file  Tobacco Use  . Smoking status: Never Smoker  . Smokeless tobacco: Never Used  Substance and Sexual Activity  . Alcohol use: Yes    Comment: social  . Drug use: No  . Sexual activity: Not on file  Other Topics Concern  . Not on file  Social History Narrative  . Not on file   Social Determinants of Health   Financial Resource Strain: Not on file  Food Insecurity: Not on file  Transportation Needs: Not on file  Physical Activity: Not on file  Stress: Not on file  Social Connections: Not on file     Family History: The patient's family history includes Cancer - Colon in his father.  ROS:   Please see the history of present illness.    Review of Systems  Constitutional: Negative for chills and fever.  HENT: Negative for hearing loss.   Eyes: Negative for blurred vision and redness.  Respiratory: Negative for shortness of breath.   Cardiovascular: Negative for chest pain, palpitations, orthopnea, claudication, leg swelling and PND.  Gastrointestinal: Negative for nausea and vomiting.  Genitourinary: Negative for dysuria.  Musculoskeletal: Negative for myalgias.  Neurological: Negative for dizziness and loss of consciousness.  Endo/Heme/Allergies: Negative for polydipsia.  Psychiatric/Behavioral: Negative for substance abuse.    EKGs/Labs/Other Studies Reviewed:    The following studies were reviewed today: No cardiac studies  EKG:  EKG 09/28/19: NSR with HR 65  Recent Labs: No results found for requested labs within last 8760 hours.  Recent Lipid Panel    Component Value Date/Time   CHOL 152 09/26/2019 0856   TRIG 218 (H) 09/26/2019 0856   HDL 46 09/26/2019 0856   CHOLHDL 3.3 09/26/2019 0856   LDLCALC 70 09/26/2019 0856     Physical Exam:    VS:  BP (!) 140/94   Pulse 74   Ht 6\' 1"  (1.854 m)   Wt 276 lb 3.2 oz (125.3 kg)   SpO2 98%   BMI 36.44  kg/m     Wt Readings from Last 3 Encounters:  03/04/20 276 lb 3.2 oz (125.3 kg)  09/26/19 260 lb (117.9 kg)  11/21/18 250 lb (113.4 kg)     GEN:  Well nourished, well developed in no acute distress HEENT: Normal NECK: No JVD; No carotid bruits CARDIAC: RRR, no murmurs, rubs, gallops RESPIRATORY:  Clear to auscultation without rales, wheezing or rhonchi  ABDOMEN: Soft, non-tender, non-distended MUSCULOSKELETAL:  No edema; No deformity  SKIN: Warm and dry NEUROLOGIC:  Alert and oriented x 3 PSYCHIATRIC:  Normal affect   ASSESSMENT:    1. Primary hypertension   2. Palpitations   3. Obesity (BMI 35.0-39.9 without comorbidity)    PLAN:    In order of problems listed above:  #HTN: SBP running 160s on home cuff. Has cut back on  salt and caffeine intake, but has been persistently elevated.  -Start amlodipine 5mg  daily today and up-titrate as needed -Monitor blood pressures at home as likely will need 2 agents -Add HCTZ if needed if BP persistently elevated with amlodipine  # Palpitations/jitteriness Resolved. Patient is active without exertional symptoms -TSH normal, Hgb normal -Continue to monitor  # Obesity: BMI 36 Patient very motivated to lose weight and increase exercise.  -Counseled about diet and exercise at length including of moderate exercise per week and diet in lean meats, fruits, vegetables as detailed below  Exercise recommendations: Goal of exercising for at least 30 minutes a day, at least 5 times per week.  Please exercise to a moderate exertion.  This means that while exercising it is difficult to speak in full sentences, however you are not so short of breath that you feel you must stop, and not so comfortable that you can carry on a full conversation.  Exertion level should be approximately a 5/10, if 10 is the most exertion you can perform.  Diet recommendations: Recommend a heart healthy diet such as the Mediterranean diet.  This diet consists  of plant based foods, healthy fats, lean meats, olive oil.  It suggests limiting the intake of simple carbohydrates such as white breads, pastries, and pastas.  It also limits the amount of red meat, wine, and dairy products such as cheese that one should consume on a daily basis.   Medication Adjustments/Labs and Tests Ordered: Current medicines are reviewed at length with the patient today.  Concerns regarding medicines are outlined above.  No orders of the defined types were placed in this encounter.  Meds ordered this encounter  Medications  . amLODipine (NORVASC) 5 MG tablet    Sig: Take 1 tablet (5 mg total) by mouth daily.    Dispense:  90 tablet    Refill:  3    Patient Instructions  Medication Instructions:  1) START AMLODIPINE 5 mg daily *If you need a refill on your cardiac medications before your next appointment, please call your pharmacy*   Follow-Up: At [facility], you and your health needs are our priority.  As part of our continuing mission to provide you with exceptional heart care, we have created designated Provider Care Teams.  These Care Teams include your primary Cardiologist (physician) and Advanced Practice Providers (APPs -  Physician Assistants and Nurse Practitioners) who all work together to provide you with the care you need, when you need it. Your next appointment:   6 month(s) The format for your next appointment:   In Person Provider:   You may see [facility], MD or one of the following Advanced Practice Providers on your designated Care Team:    Eugene Sprague, PA-C  Tereso Newcomer, [place] Aus      Signed, [place], MD  03/04/2020 9:34 AM    [facility]

## 2020-03-04 ENCOUNTER — Encounter: Payer: Self-pay | Admitting: Cardiology

## 2020-03-04 ENCOUNTER — Other Ambulatory Visit: Payer: Self-pay

## 2020-03-04 ENCOUNTER — Ambulatory Visit (INDEPENDENT_AMBULATORY_CARE_PROVIDER_SITE_OTHER): Payer: Managed Care, Other (non HMO) | Admitting: Cardiology

## 2020-03-04 VITALS — BP 140/94 | HR 74 | Ht 73.0 in | Wt 276.2 lb

## 2020-03-04 DIAGNOSIS — E669 Obesity, unspecified: Secondary | ICD-10-CM

## 2020-03-04 DIAGNOSIS — R002 Palpitations: Secondary | ICD-10-CM

## 2020-03-04 DIAGNOSIS — I10 Essential (primary) hypertension: Secondary | ICD-10-CM | POA: Diagnosis not present

## 2020-03-04 MED ORDER — AMLODIPINE BESYLATE 5 MG PO TABS: 5.0000 mg | ORAL_TABLET | Freq: Every day | ORAL | 3 refills | Status: DC

## 2020-03-04 NOTE — Patient Instructions (Signed)
Medication Instructions:  1) START AMLODIPINE 5 mg daily *If you need a refill on your cardiac medications before your next appointment, please call your pharmacy*   Follow-Up: At [facility], you and your health needs are our priority.  As part of our continuing mission to provide you with exceptional heart care, we have created designated Provider Care Teams.  These Care Teams include your primary Cardiologist (physician) and Advanced Practice Providers (APPs -  Physician Assistants and Nurse Practitioners) who all work together to provide you with the care you need, when you need it. Your next appointment:   6 month(s) The format for your next appointment:   In Person Provider:   You may see Meriam Sprague, MD or one of the following Advanced Practice Providers on your designated Care Team:    Tereso Newcomer, PA-C  [place], [place]

## 2021-02-10 ENCOUNTER — Other Ambulatory Visit: Payer: Self-pay

## 2021-02-10 MED ORDER — AMLODIPINE BESYLATE 5 MG PO TABS: 5.0000 mg | ORAL_TABLET | Freq: Every day | ORAL | 0 refills | Status: DC

## 2021-05-05 ENCOUNTER — Other Ambulatory Visit: Payer: Self-pay

## 2021-05-05 MED ORDER — AMLODIPINE BESYLATE 5 MG PO TABS: 5.0000 mg | ORAL_TABLET | Freq: Every day | ORAL | 0 refills | Status: DC

## 2021-05-11 ENCOUNTER — Telehealth: Payer: Self-pay | Admitting: Cardiology

## 2021-05-11 MED ORDER — AMLODIPINE BESYLATE 5 MG PO TABS: 5.0000 mg | ORAL_TABLET | Freq: Every day | ORAL | 2 refills | Status: DC

## 2021-05-11 NOTE — Telephone Encounter (Signed)
Pt's medication was sent to pt's pharmacy as requested. Confirmation received.  °

## 2021-05-11 NOTE — Telephone Encounter (Signed)
?*  STAT* If patient is at the pharmacy, call can be transferred to refill team. ? ? ?1. Which medications need to be refilled? (please list name of each medication and dose if known)   ? amLODipine (NORVASC) 5 MG tablet  ? ? ?2. Which pharmacy/location (including street and city if local pharmacy) is medication to be sent to? [id number] - [place], [place] - [address] ? ?3. Do they need a 30 day or 90 day supply?  ?30 day ? ?Pt has a scheduled appt with Dr. Shari Prows on 07/12/21 ? ?

## 2021-07-12 ENCOUNTER — Ambulatory Visit: Payer: Managed Care, Other (non HMO) | Admitting: Cardiology

## 2021-07-30 NOTE — Progress Notes (Unsigned)
Cardiology Office Note:    Date:  08/02/2021   ID:  Eugene Davis, DOB [birth year], MRN [id number]  PCP:  Aretta Nip, MD   [facility]  Cardiologist:  Freada Bergeron, MD  Advanced Practice Provider:  No care team member to display Electrophysiologist:  None    Referring MD: Aretta Nip, MD    History of Present Illness:    Eugene Davis is a 51 y.o. 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  During last visit on 09/26/19, the patient had been experiencing palpitations however they resolved by the time of the clinic visit. Event monitor deferred at that time. Was also having noncardiac chest pain which we were going to monitor.   Was last seen in clinic on 03/2020 where he was doing very well from a CV standpoint. Was active. BP was elevated and we started him on amlodipine 82m daily.  Today, the patient overall feels okay. Has been having HA for the past few days, but is otherwise feeling well. He is active and is able to walk 6-745mes a day with no exertional symptoms. No lightheadedness, dizziness, syncope. No LE edema, orthopnea, or PND. Does not monitor blood pressure at home. Tolerating medications.   Past Medical History:  Diagnosis Date   Allergic rhinitis    Barrett esophagus    Bee sting allergy    Complex regional pain syndrome    Complex regional pain syndrome I    Dysphagia    Esophageal stricture    Family history of GI malignancy    GERD (gastroesophageal reflux disease)    Hx of colonic polyps    Metacarpal bone fracture    left 5th   Palpitation     Past Surgical History:  Procedure Laterality Date   BALLOON DILATION N/A 10/10/2018   Procedure: BALLOON DILATION;  Surgeon: OuArta SilenceMD;  [facility];  Service: Endoscopy;  Laterality: N/A;   BALLOON DILATION N/A 11/21/2018   Procedure: BALLOON DILATION;   Surgeon: OuArta SilenceMD;  Location: [facility];  Service: Endoscopy;  Laterality: N/A;   BIOPSY  11/21/2018   Procedure: BIOPSY;  Surgeon: OuArta SilenceMD;  [facility]: [facility];  Service: Endoscopy;;   COLONOSCOPY     polypecotomy , benign per patient    ESOPHAGOGASTRODUODENOSCOPY (EGD) WITH PROPOFOL N/A 10/10/2018   Procedure: ESOPHAGOGASTRODUODENOSCOPY (EGD) WITH PROPOFOL  with  fluoroscopy;  Surgeon: OuArta SilenceMD;  [facility];  Service: Endoscopy;  Laterality: N/A;   ESOPHAGOGASTRODUODENOSCOPY (EGD) WITH PROPOFOL N/A 11/21/2018   Procedure: ESOPHAGOGASTRODUODENOSCOPY (EGD) WITH PROPOFOL;  Surgeon: OuArta SilenceMD;  [facility];  Service: Endoscopy;  Laterality: N/A;   KNEE SURGERY Right in high school   OPEN REDUCTION INTERNAL FIXATION (ORIF) METACARPAL Left 07/12/2016   Procedure: OPEN TREATMENT OF LEFT 5TH METACARPAL BASE FRACTURE;  Surgeon: ThMilly JakobMD;  [facility] Service: [facility];  Laterality: Left;   WRIST SURGERY  age 51  Current Medications: Current Meds  Medication Sig   acetaminophen (TYLENOL) 325 MG tablet Take 2 tablets (650 mg total) by mouth every 6 (six) hours as needed for mild pain or moderate pain.   amLODipine (NORVASC) 5 MG tablet Take 1 tablet (5 mg total) by mouth daily. Please keep upcoming appt with Dr. PeJohney Framen July 2023 before anymore refills. Thank you Final attempt  Ascorbic Acid (VITAMIN C) 500 MG PACK Take 1 Package by mouth daily.   fluticasone (FLONASE) 50 MCG/ACT nasal spray Place 1 spray into both nostrils daily.    Methylsulfonylmethane (MSM) POWD Take 1 Package by mouth daily.   omeprazole (PRILOSEC) 40 MG capsule Take 40 mg by mouth daily.    Sodium Chloride-Xylitol (XLEAR SINUS CARE SPRAY NA) Place 1 spray into the nose daily as needed (nasal congestion).   Wheat Dextrin (BENEFIBER) POWD Take 1 Scoop by mouth daily as needed (fiber).     Allergies:   Other    Social History   Socioeconomic History   Marital status: Single    Spouse name: Not on file   Number of children: Not on file   Years of education: Not on file   Highest education level: Not on file  Occupational History   Not on file  Tobacco Use   Smoking status: Never   Smokeless tobacco: Never  Substance and Sexual Activity   Alcohol use: Yes    Comment: social   Drug use: No   Sexual activity: Not on file  Other Topics Concern   Not on file  Social History Narrative   Not on file   Social Determinants of Health   Financial Resource Strain: Not on file  Food Insecurity: Not on file  Transportation Needs: Not on file  Physical Activity: Not on file  Stress: Not on file  Social Connections: Not on file     Family History: The patient's family history includes Cancer - Colon in his father.  ROS:   Please see the history of present illness.    Review of Systems  Constitutional:  Negative for chills and fever.  HENT:  Negative for hearing loss.   Eyes:  Negative for blurred vision and redness.  Respiratory:  Negative for shortness of breath.   Cardiovascular:  Negative for chest pain, palpitations, orthopnea, claudication, leg swelling and PND.  Gastrointestinal:  Negative for nausea and vomiting.  Genitourinary:  Negative for dysuria.  Musculoskeletal:  Negative for myalgias.  Neurological:  Negative for dizziness and loss of consciousness.  Endo/Heme/Allergies:  Negative for polydipsia.  Psychiatric/Behavioral:  Negative for substance abuse.     EKGs/Labs/Other Studies Reviewed:    The following studies were reviewed today: No cardiac studies  EKG:  EKG 08/02/21: NSR with HR 64  Recent Labs: No results found for requested labs within last 365 days.  Recent Lipid Panel    Component Value Date/Time   CHOL 152 09/26/2019 0856   TRIG 218 (H) 09/26/2019 0856   HDL 46 09/26/2019 0856   CHOLHDL 3.3 09/26/2019 0856   LDLCALC 70 09/26/2019 0856      Physical Exam:    VS:  BP (!) 138/96   Pulse 64   Ht 6' 1"  (1.854 m)   Wt 246 lb (111.6 kg)   BMI 32.46 kg/m     Wt Readings from Last 3 Encounters:  08/02/21 246 lb (111.6 kg)  03/04/20 276 lb 3.2 oz (125.3 kg)  09/26/19 260 lb (117.9 kg)     GEN:  Well nourished, well developed in no acute distress HEENT: Normal NECK: No JVD; No carotid bruits CARDIAC: RRR, no murmurs, rubs, gallops RESPIRATORY:  Clear to auscultation without rales, wheezing or rhonchi  ABDOMEN: Soft, non-tender, non-distended MUSCULOSKELETAL:  No edema; No deformity  SKIN: Warm and dry NEUROLOGIC:  Alert and oriented x 3 PSYCHIATRIC:  Normal affect   ASSESSMENT:    1. Primary  hypertension   2. Palpitations   3. Obesity (BMI 35.0-39.9 without comorbidity)   4. Chest pain of uncertain etiology   5. Medication management    PLAN:    In order of problems listed above:  #HTN: Elevated in the office to 139/98 and 131/90 on re-check but admits to being stressed this morning. Does not monitor at home. Asked him to keep a BP log and send to [place] to review. -Continue amlodipine 72m daily -Send 5 day BP log for [place] review  # Palpitations/jitteriness Resolved. Patient is active without exertional symptoms -TSH normal, Hgb normal -Continue to monitor   # Obesity: BMI 32. Patient very motivated to lose weight and increase exercise.  -Counseled about diet and exercise at length including 1534m of moderate exercise per week and diet in lean meats, fruits, vegetables as detailed below  Exercise recommendations: Goal of exercising for at least 30 minutes a day, at least 5 times per week.  Please exercise to a moderate exertion.  This means that while exercising it is difficult to speak in full sentences, however you are not so short of breath that you feel you must stop, and not so comfortable that you can carry on a full conversation.  Exertion level should be approximately a 5/10, if 10 is the most  exertion you can perform.  Diet recommendations: Recommend a heart healthy diet such as the Mediterranean diet.  This diet consists of plant based foods, healthy fats, lean meats, olive oil.  It suggests limiting the intake of simple carbohydrates such as white breads, pastries, and pastas.  It also limits the amount of red meat, wine, and dairy products such as cheese that one should consume on a daily basis.    Medication Adjustments/Labs and Tests Ordered: Current medicines are reviewed at length with the patient today.  Concerns regarding medicines are outlined above.  Orders Placed This Encounter  Procedures   Comp Met (CMET)   CBC w/Diff   TSH   HgB A1c   Lipid Profile   EKG 12-Lead   No orders of the defined types were placed in this encounter.   Patient Instructions  Medication Instructions:   Your physician recommends that you continue on your current medications as directed. Please refer to the Current Medication list given to you today.  *If you need a refill on your cardiac medications before your next appointment, please call your pharmacy*   Lab Work:  TODAY--CMET, CBC W DIFF, TSH, HEMOGLOBIN A1C, AND LIPIDS  If you have labs (blood work) drawn today and your tests are completely normal, you will receive your results only by: My[place]if you have MyChart) OR A paper copy in the mail If you have any lab test that is abnormal or we need to change your treatment, we will call you to review the results.    Follow-Up: [facility] our [facility].  As part of our continuing mission to provide you with exceptional heart care, we have created designated Provider Care Teams.  These Care Teams include your primary Cardiologist (physician) and Advanced Practice Providers (APPs -  Physician Assistants and Nurse Practitioners) who all work together to provide you with the care you need, when you need it.  We recommend signing up for  the patient portal called "MyChart".  Sign up information is provided on this After Visit Summary.  MyChart is used to connect with patients for Virtual Visits (Telemedicine).  Patients are  able to view lab/test results, encounter notes, upcoming appointments, etc.  Non-urgent messages can be sent to your provider as well.   To learn more about what you can do with MyChart, go to [email or web address].    Your next appointment:   1 year(s)  The format for your next appointment:   In Person  Provider:   Freada Bergeron, MD {   Important Information About Sugar          Signed, Freada Bergeron, MD  08/02/2021 9:11 AM    [place]

## 2021-08-02 ENCOUNTER — Encounter: Payer: Self-pay | Admitting: Cardiology

## 2021-08-02 ENCOUNTER — Ambulatory Visit (INDEPENDENT_AMBULATORY_CARE_PROVIDER_SITE_OTHER): Payer: Managed Care, Other (non HMO) | Admitting: Cardiology

## 2021-08-02 VITALS — BP 138/96 | HR 64 | Ht 73.0 in | Wt 246.0 lb

## 2021-08-02 DIAGNOSIS — R079 Chest pain, unspecified: Secondary | ICD-10-CM | POA: Diagnosis not present

## 2021-08-02 DIAGNOSIS — Z79899 Other long term (current) drug therapy: Secondary | ICD-10-CM

## 2021-08-02 DIAGNOSIS — R002 Palpitations: Secondary | ICD-10-CM

## 2021-08-02 DIAGNOSIS — I10 Essential (primary) hypertension: Secondary | ICD-10-CM

## 2021-08-02 DIAGNOSIS — E669 Obesity, unspecified: Secondary | ICD-10-CM

## 2021-08-02 LAB — CBC WITH DIFFERENTIAL/PLATELET
Basophils Absolute: 0 10*3/uL (ref 0.0–0.2)
Basos: 0 %
EOS (ABSOLUTE): 0.2 10*3/uL (ref 0.0–0.4)
Eos: 3 %
Hematocrit: 46.3 % (ref 37.5–51.0)
Hemoglobin: 16.1 g/dL (ref 13.0–17.7)
Immature Grans (Abs): 0 10*3/uL (ref 0.0–0.1)
Immature Granulocytes: 0 %
Lymphocytes Absolute: 1.8 10*3/uL (ref 0.7–3.1)
Lymphs: 25 %
MCH: 32.1 pg (ref 26.6–33.0)
MCHC: 34.8 g/dL (ref 31.5–35.7)
MCV: 92 fL (ref 79–97)
Monocytes Absolute: 0.5 10*3/uL (ref 0.1–0.9)
Monocytes: 8 %
Neutrophils Absolute: 4.6 10*3/uL (ref 1.4–7.0)
Neutrophils: 64 %
Platelets: 257 10*3/uL (ref 150–450)
RBC: 5.01 x10E6/uL (ref 4.14–5.80)
RDW: 12.4 % (ref 11.6–15.4)
WBC: 7.2 10*3/uL (ref 3.4–10.8)

## 2021-08-02 LAB — COMPREHENSIVE METABOLIC PANEL
ALT: 16 IU/L (ref 0–44)
AST: 22 IU/L (ref 0–40)
Albumin/Globulin Ratio: 1.8 (ref 1.2–2.2)
Albumin: 4.6 g/dL (ref 4.1–5.1)
Alkaline Phosphatase: 55 IU/L (ref 44–121)
BUN/Creatinine Ratio: 13 (ref 9–20)
BUN: 12 mg/dL (ref 6–24)
Bilirubin Total: 0.5 mg/dL (ref 0.0–1.2)
CO2: 24 mmol/L (ref 20–29)
Calcium: 9.6 mg/dL (ref 8.7–10.2)
Chloride: 102 mmol/L (ref 96–106)
Creatinine, Ser: 0.94 mg/dL (ref 0.76–1.27)
Globulin, Total: 2.5 g/dL (ref 1.5–4.5)
Glucose: 106 mg/dL — ABNORMAL HIGH (ref 70–99)
Potassium: 4.4 mmol/L (ref 3.5–5.2)
Sodium: 140 mmol/L (ref 134–144)
Total Protein: 7.1 g/dL (ref 6.0–8.5)
eGFR: 99 mL/min/{1.73_m2} (ref >59)

## 2021-08-02 LAB — LIPID PANEL
Chol/HDL Ratio: 3.1 ratio (ref 0.0–5.0)
Cholesterol, Total: 154 mg/dL (ref 100–199)
HDL: 49 mg/dL (ref >39)
LDL Chol Calc (NIH): 88 mg/dL (ref 0–99)
Triglycerides: 89 mg/dL (ref 0–149)
VLDL Cholesterol Cal: 17 mg/dL (ref 5–40)

## 2021-08-02 LAB — TSH: TSH: 1.45 u[IU]/mL (ref 0.450–4.500)

## 2021-08-02 LAB — HEMOGLOBIN A1C
Est. average glucose Bld gHb Est-mCnc: 105 mg/dL
Hgb A1c MFr Bld: 5.3 % (ref 4.8–5.6)

## 2021-08-02 MED ORDER — AMLODIPINE BESYLATE 5 MG PO TABS: 5.0000 mg | ORAL_TABLET | Freq: Every day | ORAL | 2 refills | Status: DC

## 2021-08-02 MED ORDER — AMLODIPINE BESYLATE 5 MG PO TABS: 5.0000 mg | ORAL_TABLET | Freq: Every day | ORAL | 3 refills | Status: DC

## 2021-08-02 NOTE — Patient Instructions (Signed)
Medication Instructions:   Your physician recommends that you continue on your current medications as directed. Please refer to the Current Medication list given to you today.  *If you need a refill on your cardiac medications before your next appointment, please call your pharmacy*   Lab Work:  TODAY--CMET, CBC W DIFF, TSH, HEMOGLOBIN A1C, AND LIPIDS  If you have labs (blood work) drawn today and your tests are completely normal, you will receive your results only by: MyChart Message (if you have MyChart) OR A paper copy in the mail If you have any lab test that is abnormal or we need to change your treatment, we will call you to review the results.    Follow-Up: At [facility], you and your health needs are our priority.  As part of our continuing mission to provide you with exceptional heart care, we have created designated Provider Care Teams.  These Care Teams include your primary Cardiologist (physician) and Advanced Practice Providers (APPs -  Physician Assistants and Nurse Practitioners) who all work together to provide you with the care you need, when you need it.  We recommend signing up for the patient portal called "MyChart".  Sign up information is provided on this After Visit Summary.  MyChart is used to connect with patients for Virtual Visits (Telemedicine).  Patients are able to view lab/test results, encounter notes, upcoming appointments, etc.  Non-urgent messages can be sent to your provider as well.   To learn more about what you can do with MyChart, go to [email or web address].    Your next appointment:   1 year(s)  The format for your next appointment:   In Person  Provider:   Meriam Sprague, MD {   Important Information About Sugar

## 2021-08-02 NOTE — Addendum Note (Signed)
Addended by: Laurance Flatten on: 08/02/2021 11:21 AM   Modules accepted: Orders

## 2022-08-29 ENCOUNTER — Other Ambulatory Visit: Payer: Self-pay | Admitting: *Deleted

## 2022-08-29 MED ORDER — AMLODIPINE BESYLATE 5 MG PO TABS: 5.0000 mg | ORAL_TABLET | Freq: Every day | ORAL | 0 refills | Status: DC

## 2022-09-28 ENCOUNTER — Other Ambulatory Visit: Payer: Self-pay | Admitting: Internal Medicine

## 2022-10-10 ENCOUNTER — Telehealth: Payer: Self-pay | Admitting: Cardiology

## 2022-10-10 NOTE — Telephone Encounter (Signed)
Patient is requesting to speak with someone to discuss what exactly was done during his last office visits. He mentions that this information is required for his secondary insurance.

## 2022-10-10 NOTE — Telephone Encounter (Signed)
Spoke with the patient and reviewed last appointment with him along with lab work that was performed.

## 2022-10-11 ENCOUNTER — Other Ambulatory Visit: Payer: Self-pay | Admitting: Internal Medicine

## 2022-10-24 ENCOUNTER — Other Ambulatory Visit: Payer: Self-pay | Admitting: Internal Medicine

## 2023-01-24 DIAGNOSIS — I10 Essential (primary) hypertension: Secondary | ICD-10-CM | POA: Insufficient documentation

## 2023-01-24 DIAGNOSIS — R002 Palpitations: Secondary | ICD-10-CM | POA: Insufficient documentation

## 2023-01-24 NOTE — Progress Notes (Deleted)
   Cardiology Office Note:    Date:  01/24/2023  ID:  Eugene Davis, DOB Dec 05, 1970, MRN [id number] PCP: Clayborn Heron, MD  [facility] Providers Cardiologist:  Meriam Sprague, MD (Inactive) { Click to update primary MD,subspecialty MD or APP then REFRESH:1}    {Click to Open Review  :1}   Patient Profile:      Hypertension Palpitations Chest pain GERD Esophageal fistula S/p EGD with balloon dilation 11/2018 Obesity       {      :1}   History of Present Illness:  Discussed the use of AI scribe software for clinical note transcription with the patient, who gave verbal consent to proceed.  Eugene Davis is a 53 y.o. male who returns for follow-up of palpitations.  He was last seen by Dr. Shari Prows in July 2023.  He had been observed for his palpitations without any specific intervention or testing.  Symptoms resolved.  ***      ROS-See HPI ***    Studies Reviewed:       *** Results          Risk Assessment/Calculations:   {Does this patient have ATRIAL FIBRILLATION?[phone]} No BP recorded.  {Refresh Note OR Click here to enter BP  :1}***       Physical Exam:   VS:  There were no vitals taken for this visit.   Wt Readings from Last 3 Encounters:  08/02/21 246 lb (111.6 kg)  03/04/20 276 lb 3.2 oz (125.3 kg)  09/26/19 260 lb (117.9 kg)    Physical Exam***     Assessment and Plan:   Assessment & Plan Palpitations  Primary hypertension   Assessment and Plan             {      :1}    {Are you ordering a CV Procedure (e.g. stress test, cath, DCCV, TEE, etc)?   Press F2        :[id number]}  Dispo:  No follow-ups on file.  Signed, Tereso Newcomer, PA-C

## 2023-01-25 ENCOUNTER — Other Ambulatory Visit: Payer: Self-pay | Admitting: Physician Assistant

## 2023-01-25 ENCOUNTER — Ambulatory Visit: Payer: BC Managed Care – PPO | Admitting: Physician Assistant

## 2023-01-25 DIAGNOSIS — I10 Essential (primary) hypertension: Secondary | ICD-10-CM

## 2023-01-25 DIAGNOSIS — R002 Palpitations: Secondary | ICD-10-CM

## 2023-01-25 NOTE — Telephone Encounter (Signed)
*  STAT* If patient is at the pharmacy, call can be transferred to refill team.   1. Which medications need to be refilled? (please list name of each medication and dose if known) aMLODIPINE   2. Would you like to learn more about the convenience, safety, & potential cost savings by using the [facility]? NO   3. Are you open to using the [facility] (Type [facility].    4. Which pharmacy/location (including street and city if local pharmacy) is medication to be sent to?[facility]   5. Do they need a 30 day or 90 day supply? 30

## 2023-01-26 MED ORDER — AMLODIPINE BESYLATE 5 MG PO TABS: 5.0000 mg | ORAL_TABLET | Freq: Every day | ORAL | 0 refills | Status: DC

## 2023-02-10 ENCOUNTER — Other Ambulatory Visit: Payer: Self-pay | Admitting: Physician Assistant

## 2023-02-15 ENCOUNTER — Telehealth: Payer: Self-pay | Admitting: *Deleted

## 2023-02-15 ENCOUNTER — Other Ambulatory Visit: Payer: Self-pay | Admitting: Emergency Medicine

## 2023-02-15 ENCOUNTER — Ambulatory Visit: Payer: BC Managed Care – PPO | Attending: Physician Assistant | Admitting: Emergency Medicine

## 2023-02-15 ENCOUNTER — Encounter: Payer: Self-pay | Admitting: Emergency Medicine

## 2023-02-15 ENCOUNTER — Ambulatory Visit (INDEPENDENT_AMBULATORY_CARE_PROVIDER_SITE_OTHER): Payer: BC Managed Care – PPO

## 2023-02-15 VITALS — BP 154/94 | HR 91 | Ht 73.0 in | Wt 292.8 lb

## 2023-02-15 DIAGNOSIS — G4730 Sleep apnea, unspecified: Secondary | ICD-10-CM | POA: Diagnosis not present

## 2023-02-15 DIAGNOSIS — Z79899 Other long term (current) drug therapy: Secondary | ICD-10-CM | POA: Diagnosis not present

## 2023-02-15 DIAGNOSIS — R002 Palpitations: Secondary | ICD-10-CM

## 2023-02-15 DIAGNOSIS — I10 Essential (primary) hypertension: Secondary | ICD-10-CM

## 2023-02-15 DIAGNOSIS — E669 Obesity, unspecified: Secondary | ICD-10-CM

## 2023-02-15 DIAGNOSIS — I493 Ventricular premature depolarization: Secondary | ICD-10-CM | POA: Diagnosis not present

## 2023-02-15 DIAGNOSIS — R079 Chest pain, unspecified: Secondary | ICD-10-CM | POA: Diagnosis not present

## 2023-02-15 LAB — LIPID PANEL
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
## 2023-02-15 NOTE — Telephone Encounter (Signed)
Patient agreement reviewed and signed on 02/15/2023.  WatchPAT issued to patient on 02/15/2023 by Elliot Cousin, CMA Patient aware to not open the WatchPAT box until contacted with the activation PIN. Patient profile initialized in CloudPAT on 02/15/2023 by Dennis Bast, RN. Device serial number: [id number]  Please list Reason for Call as Advice Only and type "WatchPAT issued to patient" in the comment box.

## 2023-02-15 NOTE — Progress Notes (Unsigned)
Enrolled for [employer] to mail a ZIO XT long term holter monitor to the patients address on file.   DOD to read.

## 2023-02-15 NOTE — Patient Instructions (Addendum)
Medication Instructions:  Your physician has recommended you make the following change in your medication:   INCREASE Amlodipine to 10 mg taking 1 daily  *If you need a refill on your cardiac medications before your next appointment, please call your pharmacy*   Lab Work: TODAY:  BMET, CBC, & LIPID  [facility] locations:   [facility] - [address] ([address] office) - [address] ([facility]) - [address] [phone]   [place] [facility] [address].    [place]  - [address]    [place] - [address] - [address] [employer]  - [address] [place] - [address] Chief Technology Officer)  If you have labs (blood work) drawn today and your tests are completely normal, you will receive your results only by: [employer] (if you have MyChart) OR A paper copy in the mail If you have any lab test that is abnormal or we need to change your treatment, we will call you to review the results.   Testing/Procedures: Your physician has recommended that you have a sleep study. This test records several body functions during sleep, including: brain activity, eye movement, oxygen and carbon dioxide blood levels, heart rate and rhythm, breathing rate and rhythm, the flow of air through your mouth and nose, snoring, body muscle movements, and chest and belly movement.   You have been given a Itamar sleep study device.  Please do not open until we call you with the PIN # to access.    ZIO XT- Long Term Monitor Instructions  Your physician has requested you wear a ZIO patch monitor for 3 days.  This is a single patch monitor. [employer] supplies one patch monitor per enrollment. Additional stickers are not available. Please do not apply patch if you will be having a Nuclear Stress Test,  Echocardiogram, Cardiac CT, MRI, or Chest Xray during  the period you would be wearing the  monitor. The patch cannot be worn during these tests. You cannot remove and re-apply the  ZIO XT patch monitor.  Your ZIO patch monitor will be mailed 3 day USPS to your address on file. It may take 3-5 days  to receive your monitor after you have been enrolled.  Once you have received your monitor, please review the enclosed instructions. Your monitor  has already been registered assigning a specific monitor serial # to you.  Billing and Patient Assistance Program Information  We have supplied [employer] with any of your insurance information on file for billing purposes. [employer] offers a sliding scale Patient Assistance Program for patients that do not have  insurance, or whose insurance does not completely cover the cost of the ZIO monitor.  You must apply for the Patient Assistance Program to qualify for this discounted rate.  To apply, please call [employer] at [phone], select option 4, select option 2, ask to apply for  Patient Assistance Program. Meredeth Ide will ask your household income, and how many people  are in your household. They will quote your out-of-pocket cost based on that information.  [employer] will also be able to set up a 75-month, interest-free payment plan if needed.  Applying the monitor   Shave hair from upper left chest.  Hold abrader disc by orange tab. Rub abrader in 40 strokes over the upper left chest as  indicated  in your monitor instructions.  Clean area with 4 enclosed alcohol pads. Let dry.  Apply patch as indicated in monitor instructions. Patch will be placed under collarbone on left  side of chest with arrow pointing upward.  Rub patch adhesive wings for 2 minutes. Remove white label marked "1". Remove the white  label marked "2". Rub patch adhesive wings for 2 additional minutes.  While looking in a mirror, press and release button in center of patch. A small green light will  flash 3-4 times. This will be your only  indicator that the monitor has been turned on.  Do not shower for the first 24 hours. You may shower after the first 24 hours.  Press the button if you feel a symptom. You will hear a small click. Record Date, Time and  Symptom in the Patient Logbook.  When you are ready to remove the patch, follow instructions on the last 2 pages of Patient  Logbook. Stick patch monitor onto the last page of Patient Logbook.  Place Patient Logbook in the blue and white box. Use locking tab on box and tape box closed  securely. The blue and white box has prepaid postage on it. Please place it in the mailbox as  soon as possible. Your physician should have your test results approximately 7 days after the  monitor has been mailed back to [facility].  Call [facility] at [phone] if you have questions regarding  your ZIO XT patch monitor. Call them immediately if you see an orange light blinking on your  monitor.  If your monitor falls off in less than 4 days, contact our Monitor department at [phone].  If your monitor becomes loose or falls off after 4 days call [employer] at [phone] for  suggestions on securing your monitor    Follow-Up: At [facility], you and your health needs are our priority.  As part of our continuing mission to provide you with exceptional heart care, we have created designated Provider Care Teams.  These Care Teams include your primary Cardiologist (physician) and Advanced Practice Providers (APPs -  Physician Assistants and Nurse Practitioners) who all work together to provide you with the care you need, when you need it.  We recommend signing up for the patient portal called "MyChart".  Sign up information is provided on this After Visit Summary.  MyChart is used to connect with patients for Virtual Visits (Telemedicine).  Patients are able to view lab/test results, encounter notes, upcoming appointments, etc.  Non-urgent messages can be sent  to your provider as well.   To learn more about what you can do with MyChart, go to [email or web address].    Your next appointment:   3 month(s)  Provider:   Rise Paganini, NP        Other Instructions      1st Floor: - Lobby - Registration  - Pharmacy  - Lab - Cafe  2nd Floor: - PV Lab - Diagnostic Testing (echo, CT, nuclear med)  3rd Floor: - Vacant  4th Floor: - TCTS (cardiothoracic surgery) - AFib Clinic - Structural Heart Clinic - Vascular Surgery  - Vascular Ultrasound  5th Floor: - HeartCare Cardiology (general and EP) - Clinical Pharmacy for coumadin, hypertension, lipid, weight-loss medications, and med management appointments    Valet parking services will be available as well.

## 2023-02-16 LAB — BASIC METABOLIC PANEL
BUN/Creatinine Ratio: 14 (ref 9–20)
BUN: 12 mg/dL (ref 6–24)
CO2: 21 mmol/L (ref 20–29)
Calcium: 9.3 mg/dL (ref 8.7–10.2)
Chloride: 101 mmol/L (ref 96–106)
Creatinine, Ser: 0.85 mg/dL (ref 0.76–1.27)
Glucose: 104 mg/dL — ABNORMAL HIGH (ref 70–99)
Potassium: 4.6 mmol/L (ref 3.5–5.2)
Sodium: 139 mmol/L (ref 134–144)
eGFR: 105 mL/min/{1.73_m2} (ref >59)

## 2023-02-16 LAB — LIPID PANEL
Cholesterol, Total: 147 mg/dL (ref 100–199)
HDL: 40 mg/dL (ref >39)
LDL CALC COMMENT:: 3.7 ratio (ref 0.0–5.0)
LDL Chol Calc (NIH): 88 mg/dL (ref 0–99)
Triglycerides: 102 mg/dL (ref 0–149)
VLDL Cholesterol Cal: 19 mg/dL (ref 5–40)

## 2023-02-16 LAB — CBC
Hematocrit: 44 % (ref 37.5–51.0)
Hemoglobin: 15.1 g/dL (ref 13.0–17.7)
MCH: 31.7 pg (ref 26.6–33.0)
MCHC: 34.3 g/dL (ref 31.5–35.7)
MCV: 92 fL (ref 79–97)
Platelets: 280 10*3/uL (ref 150–450)
RBC: 4.77 x10E6/uL (ref 4.14–5.80)
RDW: 12.3 % (ref 11.6–15.4)
WBC: 10.7 10*3/uL (ref 3.4–10.8)

## 2023-02-20 ENCOUNTER — Other Ambulatory Visit: Payer: Self-pay

## 2023-02-20 ENCOUNTER — Telehealth: Payer: Self-pay | Admitting: Emergency Medicine

## 2023-02-20 MED ORDER — AMLODIPINE BESYLATE 10 MG PO TABS: 10.0000 mg | ORAL_TABLET | Freq: Every day | ORAL | 1 refills | Status: DC

## 2023-02-20 NOTE — Telephone Encounter (Signed)
 *  STAT* If patient is at the pharmacy, call can be transferred to refill team.   1. Which medications need to be refilled? (please list name of each medication and dose if known)   -Will increase amlodipine to 10 mg daily per visit note on 02/15/23  2. Which pharmacy/location (including street and city if local pharmacy) is medication to be sent to?  [id number] - [place], [place] - [address]    3. Do they need a 30 day or 90 day supply? 30 day    patient states medication was not called in from the 02/15/23 visit where it was stated that the dosage would be increased

## 2023-03-07 ENCOUNTER — Telehealth: Payer: Self-pay

## 2023-03-07 DIAGNOSIS — R002 Palpitations: Secondary | ICD-10-CM | POA: Diagnosis not present

## 2023-03-07 DIAGNOSIS — I493 Ventricular premature depolarization: Secondary | ICD-10-CM

## 2023-03-07 NOTE — Telephone Encounter (Signed)
 Patient stated his anti-snoring device is an oral mouth guard. I will send message to Dr. Mayford Knife about wearing the mouth guard during the Home Sleep Study and will advise patient as soon as possible.

## 2023-03-08 NOTE — Telephone Encounter (Signed)
 Ordering provider: M. Fountain Associated diagnoses: Sleep Disorder Breathing WatchPAT PA obtained on 03/08/2023 by Brunetta Genera, LPN. Authorization: Yes; tracking ID [id number] Patient notified of PIN (1234) on 03/08/2023 via Notification Method: phone.  Phone note routed to covering staff for follow-up.  Instructions for covering staff:  Please contact patient in 2 weeks if WatchPAT study results are not available yet. Remind patient to complete test.  If patient declines to proceed with test, please confirm that box is unopened and remind patient to return it to the office within 30 days. Route phone note to CV DIV SLEEP STUDIES pool for tracking.  If box has been opened, please route phone note to CV DIV SLEEP STUDIES pool to have device de-initialized and processed for billing.

## 2023-03-10 ENCOUNTER — Encounter (INDEPENDENT_AMBULATORY_CARE_PROVIDER_SITE_OTHER): Payer: Self-pay | Admitting: Cardiology

## 2023-03-10 DIAGNOSIS — G4733 Obstructive sleep apnea (adult) (pediatric): Secondary | ICD-10-CM | POA: Diagnosis not present

## 2023-03-14 ENCOUNTER — Ambulatory Visit: Attending: Emergency Medicine

## 2023-03-14 DIAGNOSIS — G4730 Sleep apnea, unspecified: Secondary | ICD-10-CM

## 2023-03-14 NOTE — Procedures (Signed)
    SLEEP STUDY REPORT Patient Information Study Date: 03/10/2023 Patient Name: Eugene Davis Patient ID: [id number] Birth Date: 1970/02/28 Age: 53 Gender: Male BMI: 38.9 (W=293 lb, H=6' 1'') Stopbang: 7 Referring Physician: Rise Paganini, NP  TEST DESCRIPTION: Home sleep apnea testing was completed using the WatchPat, a Type 1 device, utilizing  peripheral arterial tonometry (PAT), chest movement, actigraphy, pulse oximetry, pulse rate, body position and snore.  AHI was calculated with apnea and hypopnea using valid sleep time as the denominator. RDI includes apneas,  hypopneas, and RERAs. The data acquired and the scoring of sleep and all associated events were performed in  accordance with the recommended standards and specifications as outlined in the AASM Manual for the Scoring of  Sleep and Associated Events 2.2.0 (2015).  FINDINGS:  1. Severe Obstructive Sleep Apnea with AHI 76.5/hr.   2. No Central Sleep Apnea with pAHIc 3.1/hr.  3. Oxygen desaturations as low as 71%.  4. Moderate snoring was present. O2 sats were < 88% for 149.5 min.  5. Total sleep time was 7 hrs and 23 min.  6. 24.8 % of total sleep time was spent in REM sleep.   7. Shortened sleep onset latency at 5 min.   8. Shortened REM sleep onset latency at 43 min.   9. Total awakenings were 5.  10. Arrhythmia detection: None  DIAGNOSIS:  Severe Obstructive Sleep Apnea (G47.33) Nocturnal Hypoxemia  RECOMMENDATIONS: 1. Clinical correlation of these findings is necessary. The decision to treat obstructive sleep apnea (OSA) is usually  based on the presence of apnea symptoms or the presence of associated medical conditions such as Hypertension,  Congestive Heart Failure, Atrial Fibrillation or Obesity. The most common symptoms of OSA are snoring, gasping for  breath while sleeping, daytime sleepiness and fatigue.   2. Initiating apnea therapy is recommended given the presence of symptoms and/or associated  conditions.  Recommend proceeding with one of the following:   a. Auto-CPAP therapy with a pressure range of 5-20cm H2O.   b. An oral appliance (OA) that can be obtained from certain dentists with expertise in sleep medicine. These are  primarily of use in non-obese patients with mild and moderate disease.   c. An ENT consultation which may be useful to look for specific causes of obstruction and possible treatment  options.   d. If patient is intolerant to PAP therapy, consider referral to ENT for evaluation for hypoglossal nerve stimulator.   3. Close follow-up is necessary to ensure success with CPAP or oral appliance therapy for maximum benefit .  4. A follow-up oximetry study on CPAP is recommended to assess the adequacy of therapy and determine the need  for supplemental oxygen or the potential need for Bi-level therapy. An arterial blood gas to determine the adequacy of  baseline ventilation and oxygenation should also be considered.  5. Healthy sleep recommendations include: adequate nightly sleep (normal 7-9 hrs/night), avoidance of caffeine after  noon and alcohol near bedtime, and maintaining a sleep environment that is cool, dark and quiet.  6. Weight loss for overweight patients is recommended. Even modest amounts of weight loss can significantly  improve the severity of sleep apnea.  7. Snoring recommendations include: weight loss where appropriate, side sleeping, and avoidance of alcohol before  bed.  8. Operation of motor vehicle should be avoided when sleepy.  Signature: Armanda Magic, MD; [facility]; Diplomat, American Board of Sleep  Medicine Electronically Signed: 03/14/2023 11:21:43 AM

## 2023-03-24 ENCOUNTER — Telehealth: Payer: Self-pay | Admitting: *Deleted

## 2023-03-24 DIAGNOSIS — G4733 Obstructive sleep apnea (adult) (pediatric): Secondary | ICD-10-CM

## 2023-03-24 DIAGNOSIS — I493 Ventricular premature depolarization: Secondary | ICD-10-CM | POA: Diagnosis not present

## 2023-03-24 DIAGNOSIS — R002 Palpitations: Secondary | ICD-10-CM | POA: Diagnosis not present

## 2023-03-24 NOTE — Telephone Encounter (Signed)
Called results lmtcb. 

## 2023-03-28 ENCOUNTER — Other Ambulatory Visit: Payer: Self-pay | Admitting: *Deleted

## 2023-03-28 MED ORDER — METOPROLOL TARTRATE 25 MG PO TABS: 25.0000 mg | ORAL_TABLET | Freq: Two times a day (BID) | ORAL | 1 refills | Status: DC

## 2023-04-10 NOTE — Telephone Encounter (Signed)
 The patient has been notified of the result and verbalized understanding.  All questions (if any) were answered. Latrelle Dodrill, CMA 04/10/2023 2:01 PM    Will precert titration

## 2023-04-10 NOTE — Telephone Encounter (Signed)
 Prior Authorization for TITRATION sent to [facility] via web portal. Tracking Number. [id number] - Approval Valid Through:04/10/2023 - 06/08/2023 Order ID: [id number]

## 2023-05-01 DIAGNOSIS — K449 Diaphragmatic hernia without obstruction or gangrene: Secondary | ICD-10-CM | POA: Diagnosis not present

## 2023-05-01 DIAGNOSIS — K2270 Barrett's esophagus without dysplasia: Secondary | ICD-10-CM | POA: Diagnosis not present

## 2023-05-15 ENCOUNTER — Ambulatory Visit: Payer: BC Managed Care – PPO | Admitting: Emergency Medicine

## 2023-05-30 ENCOUNTER — Ambulatory Visit: Admitting: Emergency Medicine

## 2023-05-31 ENCOUNTER — Telehealth: Payer: Self-pay | Admitting: Cardiology

## 2023-05-31 NOTE — Telephone Encounter (Signed)
 Spoke to patient Dr.Turner's advice given.

## 2023-05-31 NOTE — Telephone Encounter (Signed)
  Patient states that he was told to use his dental sleep apnea device but he states that it broke this weekend. He is getting it repaired but won't have it back in time for the sleep study. Does he need to reschedule?

## 2023-05-31 NOTE — Telephone Encounter (Signed)
 Spoke to patient he stated he broke dental sleep apnea device.He has a sleep study titration 6/1.He wants to know if he needs to reschedule appointment.I will send message to Dr.Turner for advice.

## 2023-06-04 ENCOUNTER — Ambulatory Visit (HOSPITAL_BASED_OUTPATIENT_CLINIC_OR_DEPARTMENT_OTHER): Attending: Cardiology | Admitting: Cardiology

## 2023-06-04 DIAGNOSIS — G4733 Obstructive sleep apnea (adult) (pediatric): Secondary | ICD-10-CM | POA: Diagnosis not present

## 2023-06-05 NOTE — Procedures (Signed)
   Maryan Smalling [facility] [address] [place], [place] [address] Tel: [phone]   Fax: [phone]  Titration Interpretation  Patient Name:  Eugene Davis, Eugene Davis Date:  06/04/2023 Referring Physician:  Gaylyn Keas, MD  Indications for Polysomnography The patient is a 53 year old - who is 6\' 1"  and weighs 280.0 lbs. His/her BMI equals 37.1.  A full night titration treatment study was performed.  Medication  No Data.   Polysomnogram Data A full night polysomnogram recorded the standard physiologic parameters including EEG, EOG, EMG, EKG, nasal and oral airflow.  Respiratory parameters of chest and abdominal movements were recorded with Respiratory Inductance Plethysmography belts.  Oxygen saturation was recorded by pulse oximetry.   Sleep Architecture The total recording time of the polysomnogram was 381.2 minutes.  The total sleep time was 347.0 minutes.  The patient spent 4.3% of total sleep time in Stage N1, 24.6% in Stage N2, 33.4% in Stages N3, and 37.6% in REM.  Sleep latency was 3.2 minutes.  REM latency was 67.5 minutes.  Sleep Efficiency was 91.0%.  Wake after Sleep Onset time was 31.0 minutes.  Titration Summary The patient was titrated at pressures ranging from 8/3 cm/H20 up to 23/17 cm/H2O.  The last pressure used in the study was 17/11 cm/H20.  Respiratory Events The polysomnogram revealed a presence of 31 obstructive, 6 central, and 0 mixed apneas resulting in an Apnea index of 6.4 events per hour.  There were 59 hypopneas (>=3% desaturation and/or arousal) resulting in an Apnea\Hypopnea Index (AHI >=3% desaturation and/or arousal) of 16.6 events per hour.  There were 54 hypopneas (>=4% desaturation) resulting in an Apnea\Hypopnea Index (AHI >=4% desaturation) of 15.7 events per hour.  There were 4 Respiratory Effort Related Arousals resulting in a RERA index of 0.7 events per hour. The Respiratory Disturbance Index is 17.3 events per hour.  The  snore index was 0 events per hour.  Mean oxygen saturation was 94.3%.  The lowest oxygen saturation during sleep was 87.0%.  Time spent <=88% oxygen saturation was 1.7 minutes (0.4%).  Limb Activity There were 85 limb movements recorded.  Of this total, 85 were classified as PLMs.  Of the PLMs, 1 were associated with arousals.  The Limb Movement index was 14.7 per hour while the PLM index was 14.7 per hour.  Cardiac Summary The average pulse rate was 63.7 bpm.  The minimum pulse rate was 51.0 bpm while the maximum pulse rate was 95.0 bpm.  Cardiac rhythm was Normal with rare PACs and PVCs.  Diagnosis:  Obstructive Sleep Apnea Unsuccessful CPAP titration due to ongoing respiratory events. Successful BIPAP titration  Recommendations: Recommend a trial of ResMed BiPAP at 14/10cm H2O with heated humidity and medium ResMed F20 mask. The patient should be counseled on good sleep hygiene. Encourage the patient to avoid driving when sleepy. The patient should be encouraged to avoid sleeping in the supine position. Followup in the Sleep Medicine Clinic in 6 weeks.   This study was personally reviewed and electronically signed by: Gaylyn Keas, MD Accredited Board Certified in Sleep Medicine Date/Time: 06/05/2023 12:15PM

## 2023-06-07 ENCOUNTER — Telehealth: Payer: Self-pay

## 2023-06-07 DIAGNOSIS — G4730 Sleep apnea, unspecified: Secondary | ICD-10-CM

## 2023-06-07 DIAGNOSIS — R002 Palpitations: Secondary | ICD-10-CM

## 2023-06-07 DIAGNOSIS — I493 Ventricular premature depolarization: Secondary | ICD-10-CM

## 2023-06-07 DIAGNOSIS — G4733 Obstructive sleep apnea (adult) (pediatric): Secondary | ICD-10-CM

## 2023-06-07 NOTE — Telephone Encounter (Signed)
 Notified patient of Titration results. Patient stated he is currently using a mouthguard at night and he feels the mouthguard is helping. Patient is declining PAP Therapy. Patient stated he did not have mouthguard in when he completed his sleep studies.

## 2023-06-07 NOTE — Telephone Encounter (Signed)
-----   Message from Gaylyn Keas sent at 06/05/2023 12:18 PM EDT ----- Please let patient know that they had a successful PAP titration and let DME know that orders are in EPIC.  Please set up 6 week OV with me.

## 2023-06-08 NOTE — Telephone Encounter (Signed)
 Notified patient of provider recommendations of repeating Home Sleep Study with mouthguard in use. Patient notified that we will pre-cert HST and call him to come in and pick up device.

## 2023-07-11 DIAGNOSIS — H109 Unspecified conjunctivitis: Secondary | ICD-10-CM | POA: Diagnosis not present

## 2023-07-11 DIAGNOSIS — J019 Acute sinusitis, unspecified: Secondary | ICD-10-CM | POA: Diagnosis not present

## 2023-07-25 ENCOUNTER — Ambulatory Visit: Admitting: Emergency Medicine

## 2023-08-08 ENCOUNTER — Other Ambulatory Visit: Payer: Self-pay

## 2023-08-08 MED ORDER — AMLODIPINE BESYLATE 10 MG PO TABS: 10.0000 mg | ORAL_TABLET | Freq: Every day | ORAL | 2 refills | Status: AC

## 2023-09-01 ENCOUNTER — Encounter: Payer: Self-pay | Admitting: *Deleted

## 2023-09-05 ENCOUNTER — Encounter: Payer: Self-pay | Admitting: Emergency Medicine

## 2023-09-05 ENCOUNTER — Ambulatory Visit: Attending: Emergency Medicine | Admitting: Emergency Medicine

## 2023-09-05 VITALS — BP 136/86 | HR 78 | Ht 73.0 in | Wt 298.2 lb

## 2023-09-05 DIAGNOSIS — G4733 Obstructive sleep apnea (adult) (pediatric): Secondary | ICD-10-CM

## 2023-09-05 DIAGNOSIS — I10 Essential (primary) hypertension: Secondary | ICD-10-CM | POA: Diagnosis not present

## 2023-09-05 DIAGNOSIS — I493 Ventricular premature depolarization: Secondary | ICD-10-CM | POA: Diagnosis not present

## 2023-09-05 DIAGNOSIS — Z6839 Body mass index (BMI) 39.0-39.9, adult: Secondary | ICD-10-CM

## 2023-09-05 DIAGNOSIS — E6609 Other obesity due to excess calories: Secondary | ICD-10-CM

## 2023-09-05 DIAGNOSIS — E66812 Obesity, class 2: Secondary | ICD-10-CM

## 2023-09-05 NOTE — Progress Notes (Signed)
 Cardiology Office Note:    Date:  09/05/2023  ID:  Eugene Davis, DOB [birth year], MRN [id number] PCP: Loretha Richerd SAUNDERS, MD  [facility] Providers Cardiologist:  Powell FORBES Sorrow, MD (Inactive) Cardiology APP:  Rana Lum CROME, NP       Patient Profile:       Chief Complaint: 52-month follow-up History of Present Illness:  Eugene Davis is a 53 y.o. male with visit-pertinent history of GERD, esophageal fistula s/p EGD with balloon dilation in 2020, hypertension, palpitations, chest pain, severe OSA   He established with cardiology service in 2021 due to chest pain.  During clinic visit 09/2019 patient had been experiencing palpitations, however palpitations resolved an event monitor was deferred at that time.  His chest pain was noncardiac and plan was to monitor.  During clinic visit 03/2020 patient's blood pressure was elevated and he was started on amlodipine  5 mg daily.   He was last seen in the office on 02/15/2023.  His blood pressure is elevated at 157/98, his amlodipine  was increased to 10 mg daily.  His EKG showed frequent PVCs.  3-day ZIO monitor was placed revealing a 13% PVC burden and patient was subsequently started on metoprolol  tartrate 25 mg twice daily.  Patient also noted ongoing fatigue.  Sleep study was ordered showing evidence of severe obstructive sleep apnea with AHI of 76.5.   Discussed the use of AI scribe software for clinical note transcription with the patient, who gave verbal consent to proceed.  History of Present Illness Eugene Davis is a 53 year old male with hypertension and severe sleep apnea who presents for follow-up of elevated blood pressure and sleep apnea management.  Today he is doing well overall.  He reports history of a retinal detachment last year and he struggles with the thought that CPAP may cause this to reoccur.  He plans to talk to his retinal specialist prior to considering CPAP therapy in the future.  He is on  metoprolol  tartrate twice daily for hypertension and PVCs but occasionally misses the evening dose.  He had a recent illness over the weekend with symptoms of coughing and vomiting.  He reports this has improved.  He is attempting weight loss but faces challenges, with weight fluctuating around 298 pounds. Recent weight increased from 292 to 298 pounds, and he finds maintaining a healthy diet difficult due to a demanding work schedule. He has not been regularly monitoring his blood pressure at home. He denies chest pain, syncope, presyncope, orthopnea, PND, lightheadedness, dizziness, or shortness of breath.  Review of systems:  Please see the history of present illness. All other systems are reviewed and otherwise negative.      Studies Reviewed:        ZIO 02/15/2023 Patch Wear Time:  9 days and 7 hours (2025-03-04T07:49:25-0500 to 2025-03-13T16:30:51-0400)   Sinus rhythm (min HR of 51 bpm, max HR of 162 bpm, and avg HR of 80 bpm).  1 run of Supraventricular Tachycardia occurred lasting 7 beats  Isolated SVEs were rare (<1.0%), SVE Triplets were rare (<1.0%), and no SVE Couplets were present.  Frequent VEs (13.0%, 43799), VE Couplets were rare (<1.0%, 136), and no VE Triplets were present. Ventricular Bigeminy and Trigeminy were present.   Risk Assessment/Calculations:         STOP-Bang Score:  7       Physical Exam:   VS:  BP 136/86 (BP Location: Left Arm, Patient Position: Sitting, Cuff Size: Normal)   Pulse  78   Ht 6' 1 (1.854 m)   Wt 298 lb 3.2 oz (135.3 kg)   SpO2 98%   BMI 39.34 kg/m    Wt Readings from Last 3 Encounters:  09/05/23 298 lb 3.2 oz (135.3 kg)  06/04/23 280 lb (127 kg)  02/15/23 292 lb 12.8 oz (132.8 kg)    GEN: Well nourished, well developed in no acute distress NECK: No JVD; No carotid bruits CARDIAC: RRR, no murmurs, rubs, gallops RESPIRATORY:  Clear to auscultation without rales, wheezing or rhonchi  ABDOMEN: Soft, non-tender,  non-distended EXTREMITIES:  No edema; No acute deformity     Assessment and Plan:  Hypertension Blood pressure today is 136/86 - Blood pressure slightly elevated above goal today.  Will have him begin taking BP at home and follow-up x 3 months for blood pressure recheck - Continue amlodipine  10 mg daily and metoprolol  tartrate 25 mg twice daily - Focus on heart healthy dieting  Frequent PVCs ZIO 03/2023 showed frequent PVCs at 13% and he was subsequently started on metoprolol  tartrate - Today he denies any palpitations or irregular heart rhythm.  Volume status is stable - I do believe his severe untreated OSA is correlated with his PVC burden.  He does note he has had improvement in symptoms since starting metoprolol .  His PVCs would likely improve once his OSA is under better control - Continue metoprolol  tartrate 25 mg twice daily - Can consider repeat ZIO months OSA is adequately controlled   Severe obstructive sleep apnea Sleep study 03/2023 showed severe OSA with AHI of 76.5 It was recommended for him to start CPAP therapy.  However patient declined CPAP therapy and opted to use his own mouth guard with uncertain effectiveness as original sleep study was without his mouthguard - Long discussion with patient over his OSA.  He tells me he is worried that CPAP will cause pressure to his retina as he has history of retinal detachment last year.  This is the reason he never started on CPAP therapy.  He plans to speak with his retinal specialist regarding starting CPAP therapy and reach out to office once this has been addressed - Can consider repeat sleep study with mouthpiece if he decides not to pursue CPAP therapy after discussion with his retinal specialist   Obesity BMI 39.34.  Weight today 298 LBS Notes activity level is limited since knee injury 2024 - Recommend DASH diet (high in vegetables, fruits, low-fat dairy products, whole grains, poultry, fish, and nuts and low in sweets,  sugar-sweetened beverages, and red meats), salt restriction and increase physical activity.  General Health maintenance He mentions he no longer has a PCP.  Has not established since he was last seen in office - Encouraged to follow-up PCP at [facility]:  Return in about 3 months (around 12/05/2023).  Signed, Lum LITTIE Louis, NP

## 2023-09-05 NOTE — Patient Instructions (Addendum)
 Medication Instructions:  NO CHANGES  Lab Work: NONE TO BE DONE TODAY.  Testing/Procedures: NONE  Follow-Up: At [facility], you and your health needs are our priority.  As part of our continuing mission to provide you with exceptional heart care, our providers are all part of one team.  This team includes your primary Cardiologist (physician) and Advanced Practice Providers or APPs (Physician Assistants and Nurse Practitioners) who all work together to provide you with the care you need, when you need it.  Your next appointment:   3 MONTHS  Provider:   MADISON FOUNTAIN, NP  We recommend signing up for the patient portal called MyChart.  Sign up information is provided on this After Visit Summary.  MyChart is used to connect with patients for Virtual Visits (Telemedicine).  Patients are able to view lab/test results, encounter notes, upcoming appointments, etc.  Non-urgent messages can be sent to your provider as well.   To learn more about what you can do with MyChart, go to [email or web address].   Other Instructions PLEASE ESTABLISH CARE WITH A PCP AT [facility].  Address & Hours  [facility] at [facility] [address] [place],  [place]  [address]  Get Driving Directions Main: [phone]  Sunday:Closed Monday:8:00 AM - 5:00 PM Tuesday:8:00 AM - 5:00 PM Wednesday:8:00 AM - 5:00 PM Thursday:8:00 AM - 5:00 PM Friday:8:00 AM - 12:00 PM Saturday:Closed Closed daily for lunch from noon to 1 p.m.   Closed New Year's Day, Good Friday, Memorial Day, July 4, Labor Day, Thanksgiving Day and the following Friday, Christmas Eve and Christmas Day   Our Providers  Damien Junk, MSW, LCSW Specialties Licensed Clinical Social Worker  No Review  Thersia Stark, FNP-C Specialties Family Medicine, Nurse Practitioner 4.8/5 (Based on 188 Reviews)  Raymond Joseph De Peru II, MD Specialties Family  Medicine  4.7/5 (Based on 223 Reviews)

## 2023-09-14 ENCOUNTER — Other Ambulatory Visit: Payer: Self-pay | Admitting: Emergency Medicine

## 2023-09-27 ENCOUNTER — Telehealth: Payer: Self-pay

## 2023-09-27 NOTE — Telephone Encounter (Signed)
 Ordering provider: Wilbert Bihari, MD Associated diagnoses:  OSA (obstructive sleep apnea) - Primary Sleep disorder breathing Palpitations PVC (premature ventricular contraction) WatchPAT PA obtained on 09/27/2023 by Lucie DELENA Ku, CMA. Authorization: # [id number] Patient notified of PIN ([id number]) on 09/27/2023 via Notification Method: MyChart message.  Phone note routed to covering staff for follow-up.

## 2023-09-28 DIAGNOSIS — K219 Gastro-esophageal reflux disease without esophagitis: Secondary | ICD-10-CM | POA: Diagnosis not present

## 2023-09-28 DIAGNOSIS — E669 Obesity, unspecified: Secondary | ICD-10-CM | POA: Diagnosis not present

## 2023-09-28 DIAGNOSIS — Z860100 Personal history of colon polyps, unspecified: Secondary | ICD-10-CM | POA: Diagnosis not present

## 2023-09-28 DIAGNOSIS — K2270 Barrett's esophagus without dysplasia: Secondary | ICD-10-CM | POA: Diagnosis not present

## 2023-12-11 ENCOUNTER — Ambulatory Visit: Admitting: Emergency Medicine

## 2024-01-15 NOTE — Telephone Encounter (Signed)
**Note De-Identified Eugene Davis Obfuscation** I called the pt and he advised me that he does not have a sleep device but that he was given the PIN # for it.  He states that he plans to come by the office week after next which is the week of 01/29/2024 to pick up a device with instructions.   He thanked me for my call.  Device is ready for pt pick up in Sleep Dept closet.

## 2024-02-09 NOTE — Telephone Encounter (Signed)
**Note De-Identified Aziyah Provencal Obfuscation** The pt states that he has not picked up the WatchPAT One-HST device from us  because he is unsure if it is needed and does not want or need the extra expense of sleep testing. He also states that he does not want a CPAP machine and that he is happy with his new oral appliance.  He states that he got a new oral appliance since his last appointment with Lum Louis, NP on 09/05/2023 and that since getting it he is sleeping better and feels better in the mornings.  Per his request, I am forwarding this message to Dr Shlomo for her recommendation.

## 2024-02-27 ENCOUNTER — Ambulatory Visit: Admitting: Emergency Medicine
